# Patient Record
Sex: Female | Born: 1995 | Race: White | Hispanic: No | Marital: Single | Smoking: Never smoker
Health system: Southern US, Community
[De-identification: ages and names within clinical notes are randomized; demographics above are authoritative.]

## PROBLEM LIST (undated history)

## (undated) DIAGNOSIS — K219 Gastro-esophageal reflux disease without esophagitis: Secondary | ICD-10-CM

## (undated) DIAGNOSIS — F909 Attention-deficit hyperactivity disorder, unspecified type: Secondary | ICD-10-CM

## (undated) DIAGNOSIS — F419 Anxiety disorder, unspecified: Secondary | ICD-10-CM

## (undated) HISTORY — DX: Gastro-esophageal reflux disease without esophagitis: K21.9

## (undated) HISTORY — DX: Attention-deficit hyperactivity disorder, unspecified type: F90.9

## (undated) HISTORY — PX: TONSILLECTOMY: SUR1361

## (undated) HISTORY — DX: Anxiety disorder, unspecified: F41.9

## (undated) HISTORY — PX: WISDOM TOOTH EXTRACTION: SHX21

---
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
## 2014-11-13 ENCOUNTER — Other Ambulatory Visit: Payer: Self-pay | Admitting: Pediatrics

## 2014-11-13 ENCOUNTER — Ambulatory Visit
Admission: RE | Admit: 2014-11-13 | Discharge: 2014-11-13 | Disposition: A | Discharge status: Home / Self Care | Payer: No Typology Code available for payment source | Source: Ambulatory Visit | Attending: Pediatrics | Admitting: Pediatrics

## 2014-11-13 DIAGNOSIS — M419 Scoliosis, unspecified: Secondary | ICD-10-CM | POA: Insufficient documentation

## 2014-11-13 DIAGNOSIS — M549 Dorsalgia, unspecified: Secondary | ICD-10-CM | POA: Insufficient documentation

## 2014-11-22 DIAGNOSIS — M2170 Unequal limb length (acquired), unspecified site: Secondary | ICD-10-CM | POA: Insufficient documentation

## 2015-02-21 ENCOUNTER — Ambulatory Visit (INDEPENDENT_AMBULATORY_CARE_PROVIDER_SITE_OTHER): Payer: BLUE CROSS/BLUE SHIELD | Admitting: Internal Medicine

## 2015-02-21 DIAGNOSIS — T753XXA Motion sickness, initial encounter: Secondary | ICD-10-CM

## 2015-02-21 DIAGNOSIS — Z7189 Other specified counseling: Secondary | ICD-10-CM

## 2015-02-21 DIAGNOSIS — Z9189 Other specified personal risk factors, not elsewhere classified: Secondary | ICD-10-CM

## 2015-02-21 DIAGNOSIS — Z7184 Encounter for health counseling related to travel: Secondary | ICD-10-CM

## 2015-02-21 DIAGNOSIS — Z23 Encounter for immunization: Secondary | ICD-10-CM | POA: Diagnosis not present

## 2015-02-21 MED ORDER — TYPHOID VACCINE PO CPDR: 1.0000 | DELAYED_RELEASE_CAPSULE | ORAL | Status: DC

## 2015-02-21 MED ORDER — ATOVAQUONE-PROGUANIL HCL 250-100 MG PO TABS: 1.0000 | ORAL_TABLET | Freq: Every day | ORAL | Status: DC

## 2015-02-21 MED ORDER — PROMETHAZINE HCL 25 MG PO TABS: 25.0000 mg | ORAL_TABLET | Freq: Four times a day (QID) | ORAL | Status: DC | PRN

## 2015-02-21 MED ORDER — CIPROFLOXACIN HCL 500 MG PO TABS: 500.0000 mg | ORAL_TABLET | Freq: Two times a day (BID) | ORAL | Status: DC

## 2015-02-21 NOTE — Progress Notes (Signed)
  RFV: pretravel counseling for [place] church trip x 2 wk Subjective:    Patient ID: Kimberly Cameron, female    DOB: July 13, 1995, 20 y.o.   MRN: [id number]  HPI  Kathryne is a Loistine Simas F who will be traveling to [place] for 2 wk leaving march 5th- returning 18th. 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  She is currently working in [employer] school.  She is uptodate ->  Dtap, hep b #3, hib, influenza, MMR x 2, polio, td, varicella. Has not had flu vaccine this past year  Review of Systems     Objective:   Physical Exam        Assessment & Plan:  Pre travel counseling  Malaria and mosquito bite prevention = gave rx for malarone to start on march 3rd. Gave recs to use DEET mosquito spray and premethrin clothing spray  Traveler's diarrhea = gave precautions and rx for cipro to use if needed  Vaccines = will give yellow fever (due to itinerary going through [place]) and also hep A #1. Will give rx for oral typhoid vaccination. Recommended she gets flu vaccination 10 days prior to leaving

## 2016-04-20 ENCOUNTER — Ambulatory Visit: Payer: Self-pay | Admitting: Internal Medicine

## 2016-04-21 ENCOUNTER — Encounter: Payer: Self-pay | Admitting: Internal Medicine

## 2016-04-21 ENCOUNTER — Ambulatory Visit (INDEPENDENT_AMBULATORY_CARE_PROVIDER_SITE_OTHER): Payer: BLUE CROSS/BLUE SHIELD | Admitting: Internal Medicine

## 2016-04-21 VITALS — BP 102/64 | HR 94 | Ht 68.0 in | Wt 177.0 lb

## 2016-04-21 DIAGNOSIS — K219 Gastro-esophageal reflux disease without esophagitis: Secondary | ICD-10-CM | POA: Insufficient documentation

## 2016-04-21 DIAGNOSIS — J342 Deviated nasal septum: Secondary | ICD-10-CM | POA: Diagnosis not present

## 2016-04-21 DIAGNOSIS — J329 Chronic sinusitis, unspecified: Secondary | ICD-10-CM | POA: Diagnosis not present

## 2016-04-21 MED ORDER — RANITIDINE HCL 75 MG/5ML PO SYRP: 150.0000 mg | ORAL_SOLUTION | Freq: Every day | ORAL | 0 refills | Status: DC

## 2016-04-21 NOTE — Patient Instructions (Signed)
Gastroesophageal Reflux Disease, Adult Normally, food travels down the esophagus and stays in the stomach to be digested. However, when a person has gastroesophageal reflux disease (GERD), food and stomach acid move back up into the esophagus. When this happens, the esophagus becomes sore and inflamed. Over time, GERD can create small holes (ulcers) in the lining of the esophagus. What are the causes? This condition is caused by a problem with the muscle between the esophagus and the stomach (lower esophageal sphincter, or LES). Normally, the LES muscle closes after food passes through the esophagus to the stomach. When the LES is weakened or abnormal, it does not close properly, and that allows food and stomach acid to go back up into the esophagus. The LES can be weakened by certain dietary substances, medicines, and medical conditions, including:  Tobacco use.  Pregnancy.  Having a hiatal hernia.  Heavy alcohol use.  Certain foods and beverages, such as coffee, chocolate, onions, and peppermint.  What increases the risk? This condition is more likely to develop in:  People who have an increased body weight.  People who have connective tissue disorders.  People who use NSAID medicines.  What are the signs or symptoms? Symptoms of this condition include:  Heartburn.  Difficult or painful swallowing.  The feeling of having a lump in the throat.  Abitter taste in the mouth.  Bad breath.  Having a large amount of saliva.  Having an upset or bloated stomach.  Belching.  Chest pain.  Shortness of breath or wheezing.  Ongoing (chronic) cough or a night-time cough.  Wearing away of tooth enamel.  Weight loss.  Different conditions can cause chest pain. Make sure to see your health care provider if you experience chest pain. How is this diagnosed? Your health care provider will take a medical history and perform a physical exam. To determine if you have mild or severe  GERD, your health care provider may also monitor how you respond to treatment. You may also have other tests, including:  An endoscopy toexamine your stomach and esophagus with a small camera.  A test thatmeasures the acidity level in your esophagus.  A test thatmeasures how much pressure is on your esophagus.  A barium swallow or modified barium swallow to show the shape, size, and functioning of your esophagus.  How is this treated? The goal of treatment is to help relieve your symptoms and to prevent complications. Treatment for this condition may vary depending on how severe your symptoms are. Your health care provider may recommend:  Changes to your diet.  Medicine.  Surgery.  Follow these instructions at home: Diet  Follow a diet as recommended by your health care provider. This may involve avoiding foods and drinks such as: ? Coffee and tea (with or without caffeine). ? Drinks that containalcohol. ? Energy drinks and sports drinks. ? Carbonated drinks or sodas. ? Chocolate and cocoa. ? Peppermint and mint flavorings. ? Garlic and onions. ? Horseradish. ? Spicy and acidic foods, including peppers, chili powder, curry powder, vinegar, hot sauces, and barbecue sauce. ? Citrus fruit juices and citrus fruits, such as oranges, lemons, and limes. ? Tomato-based foods, such as red sauce, chili, salsa, and pizza with red sauce. ? Fried and fatty foods, such as donuts, french fries, potato chips, and high-fat dressings. ? High-fat meats, such as hot dogs and fatty cuts of red and white meats, such as rib eye steak, sausage, ham, and bacon. ? High-fat dairy items, such as whole milk,   butter, and cream cheese.  Eat small, frequent meals instead of large meals.  Avoid drinking large amounts of liquid with your meals.  Avoid eating meals during the 2-3 hours before bedtime.  Avoid lying down right after you eat.  Do not exercise right after you eat. General  instructions  Pay attention to any changes in your symptoms.  Take over-the-counter and prescription medicines only as told by your health care provider. Do not take aspirin, ibuprofen, or other NSAIDs unless your health care provider told you to do so.  Do not use any tobacco products, including cigarettes, chewing tobacco, and e-cigarettes. If you need help quitting, ask your health care provider.  Wear loose-fitting clothing. Do not wear anything tight around your waist that causes pressure on your abdomen.  Raise (elevate) the head of your bed 6 inches (15cm).  Try to reduce your stress, such as with yoga or meditation. If you need help reducing stress, ask your health care provider.  If you are overweight, reduce your weight to an amount that is healthy for you. Ask your health care provider for guidance about a safe weight loss goal.  Keep all follow-up visits as told by your health care provider. This is important. Contact a health care provider if:  You have new symptoms.  You have unexplained weight loss.  You have difficulty swallowing, or it hurts to swallow.  You have wheezing or a persistent cough.  Your symptoms do not improve with treatment.  You have a hoarse voice. Get help right away if:  You have pain in your arms, neck, jaw, teeth, or back.  You feel sweaty, dizzy, or light-headed.  You have chest pain or shortness of breath.  You vomit and your vomit looks like blood or coffee grounds.  You faint.  Your stool is bloody or black.  You cannot swallow, drink, or eat. This information is not intended to replace advice given to you by your health care provider. Make sure you discuss any questions you have with your health care provider. Document Released: 09/30/2004 Document Revised: 05/21/2015 Document Reviewed: 04/17/2014 Elsevier Interactive Patient Education  2017 Elsevier Inc.  

## 2016-04-21 NOTE — Progress Notes (Signed)
Date:  04/21/2016   Name:  Kimberly Cameron   DOB:  1995/12/21   MRN:  [id number]  New patient transitioning from Pediatrics. Chief Complaint: Establish Care (Been Seeing [facility] and now needs PCP as adult. ); Gastroesophageal Reflux (Comes in waves- and only affects pt when lay down at night. Feels it in throat. Terrified of vomitting so sleeps sitting up several weeks at a time to avoid acid. Takes tums- but not helping. Doesn't want to take daily medication but jsut wants PRN medication. ); and Sinusitis (Always has sinus problems- happens atleast 5 to 6 times a year. Normally given antibiotics- doesn't never completely go away. Always has nasal drainage, bumps in throat from a lot of drainage. Has drainage today but no other symptoms. 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 She reports no chest pain or no wheezing. This is a recurrent problem. The problem occurs frequently. The problem has been waxing and waning. The heartburn is located in the substernum. The heartburn is of moderate intensity. The symptoms are aggravated by lying down and certain foods. Pertinent negatives include no fatigue. There are no known risk factors. She has tried an antacid and a PPI (but got worried about taking prilosec daily) for the symptoms. Past procedures do not include an EGD, esophageal pH monitoring, H. pylori antibody titer or a UGI.  Sinusitis  This is a recurrent problem. There has been no fever. Associated symptoms include congestion. Pertinent negatives include no chills or shortness of breath. Past treatments include oral decongestants and spray decongestants. The treatment provided mild (temporary relief only) relief.  Has recurrent infections treated with antibiotics. Not on any daily mediations.    Review of Systems  Constitutional: Negative for chills, fatigue and fever.  HENT: Positive  for congestion, postnasal drip and sinus pain.   Eyes: Negative for visual disturbance.  Respiratory: Negative for chest tightness, shortness of breath and wheezing.   Cardiovascular: Negative for chest pain and palpitations.  Gastrointestinal: Positive for heartburn and nausea.       Reflux    Patient Active Problem List   Diagnosis Date Noted  . 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 TONSILLECTOMY     in 7th grade    Social History  Substance Use Topics  . Smoking status: Never Smoker  . Smokeless tobacco: Never Used  . Alcohol use Yes     Medication list has been reviewed and updated.   Physical Exam  Constitutional: She is oriented to person, place, and time. She appears well-developed. No distress.  HENT:  Head: Normocephalic and atraumatic.  Right Ear: Tympanic membrane and ear canal normal.  Left Ear: Tympanic membrane and ear canal normal.  Nose: Septal deviation present. Right sinus exhibits no maxillary sinus tenderness and no frontal sinus tenderness. Left sinus exhibits no maxillary sinus tenderness and no frontal sinus tenderness.  Mouth/Throat: No posterior oropharyngeal edema.  Cardiovascular: Normal rate, regular rhythm and normal heart sounds.   Pulmonary/Chest: Effort normal and breath sounds normal. No respiratory distress.  Abdominal: Soft. Normal appearance and bowel sounds are normal. There is no tenderness.  Musculoskeletal: Normal range of motion.  Neurological: She is alert and oriented to person, place, and time.  Skin: Skin is warm and dry.  No rash noted.  Psychiatric: She has a normal mood and affect. Her behavior is normal. Thought content normal.  Nursing note and vitals reviewed.   BP 102/64 (BP Location: Right Arm, Patient Position: Sitting, Cuff Size: Normal)   Pulse 94   Ht  (1.727 m)   Wt 177 lb (80.3 kg)    LMP 04/07/2016 (Within Weeks)   SpO2 99%   BMI 26.91 kg/m   Assessment and Plan: 1. Gastroesophageal reflux disease, esophagitis presence not specified Begin zantac qPM Elevate HOB Continue PRN tums - ranitidine (ZANTAC) 75 MG/5ML syrup; Take 10 mLs (150 mg total) by mouth daily.  Dispense: 180 mL; Refill: 0  2. Chronic sinusitis, unspecified location claritin daily Avoid Flonase due to nose bleeds - Ambulatory referral to ENT  3. Deviated nasal septum - Ambulatory referral to ENT   Meds ordered this encounter  Medications  . ranitidine (ZANTAC) 75 MG/5ML syrup    Sig: Take 10 mLs (150 mg total) by mouth daily.    Dispense:  180 mL    Refill:  0    No grape flavor please    Bari Edward, MD [facility]  04/21/2016

## 2016-07-10 IMAGING — CR DG SACRUM/COCCYX 2+V
1 series · 3 of 3 positions shown · non-contrast
Comparison: None.

CLINICAL DATA: Chronic low back pain.  No trauma history submitted.

EXAM:
SACRUM AND COCCYX - 2+ VIEW

[Series 1: dg sacrum/coccyx · 0.14mm/px · 3 of 3 slices shown]
[im 1/3]
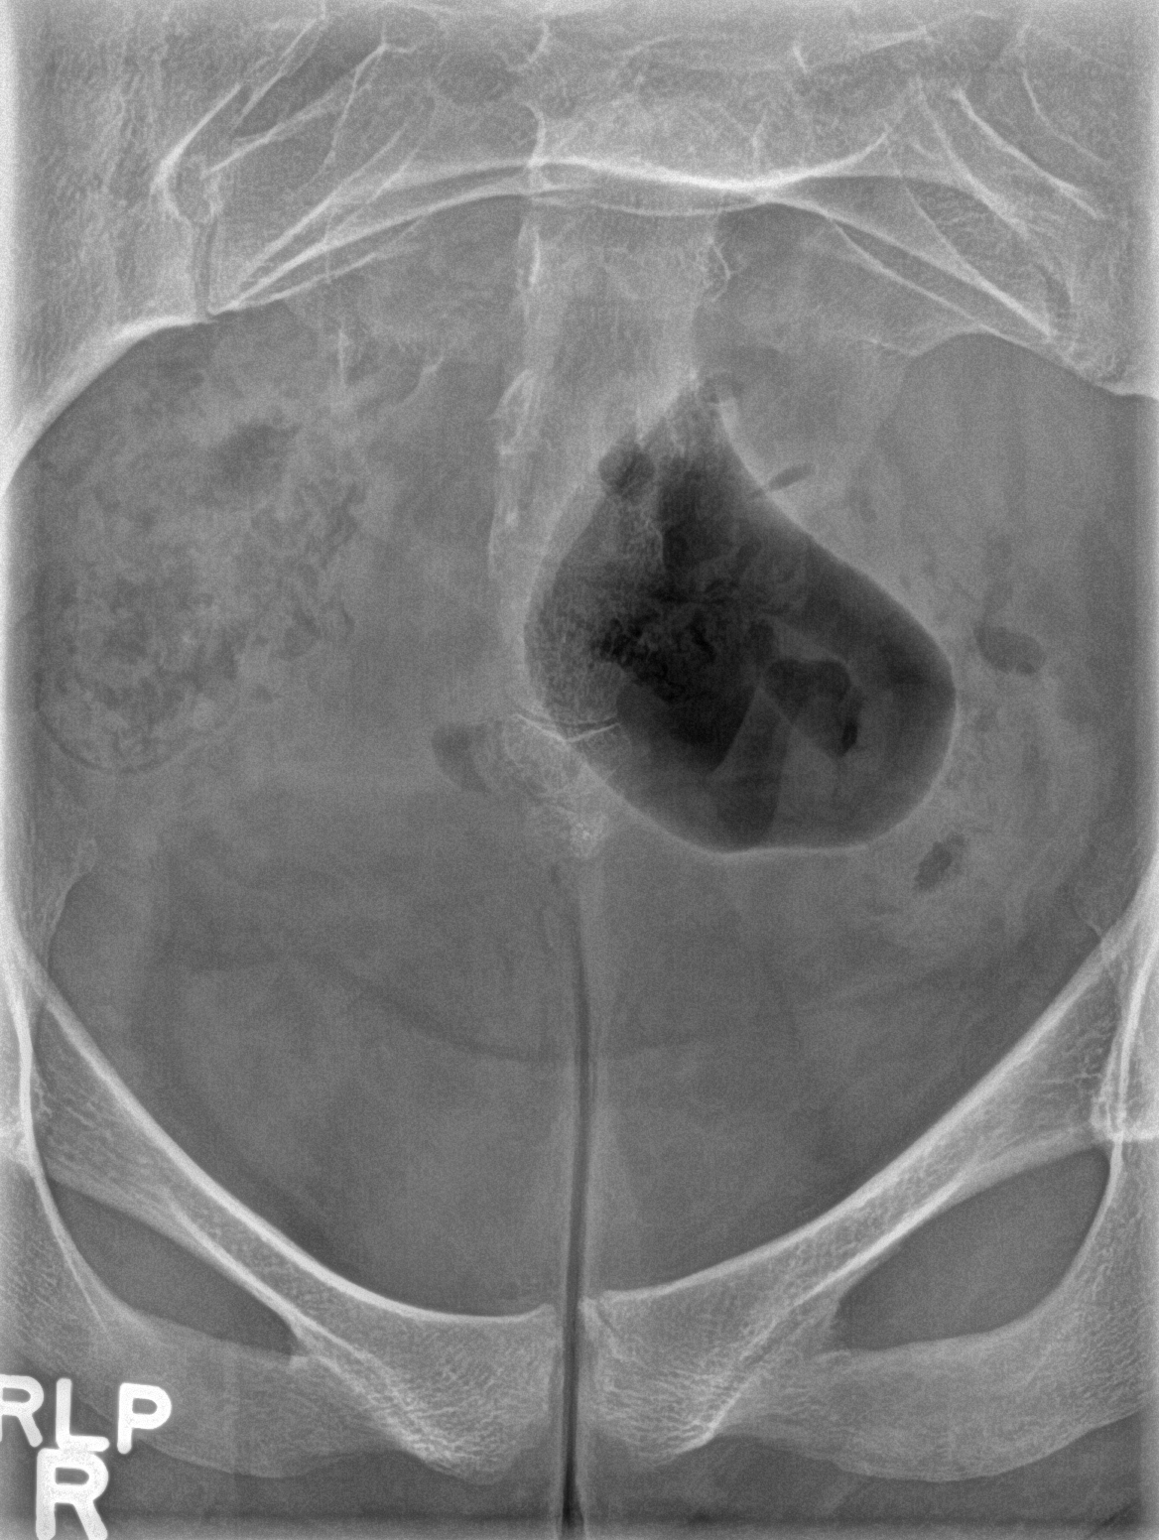
[im 2/3]
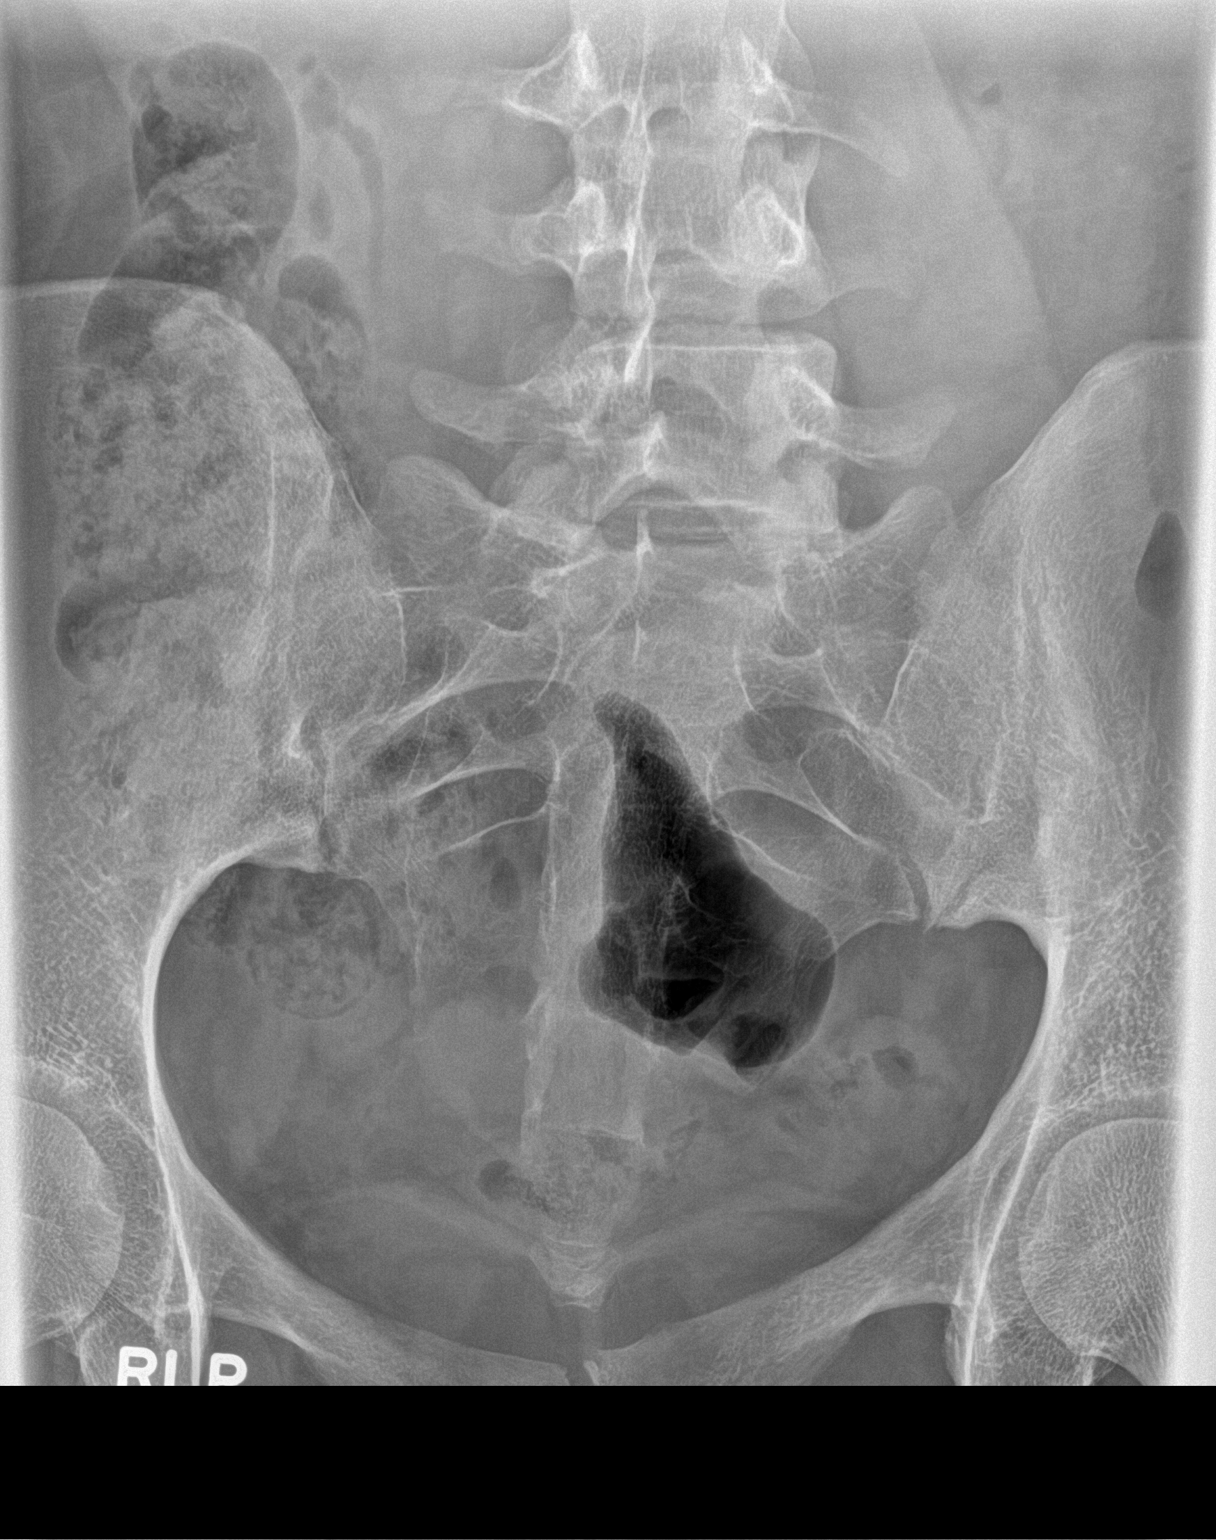
[im 3/3]
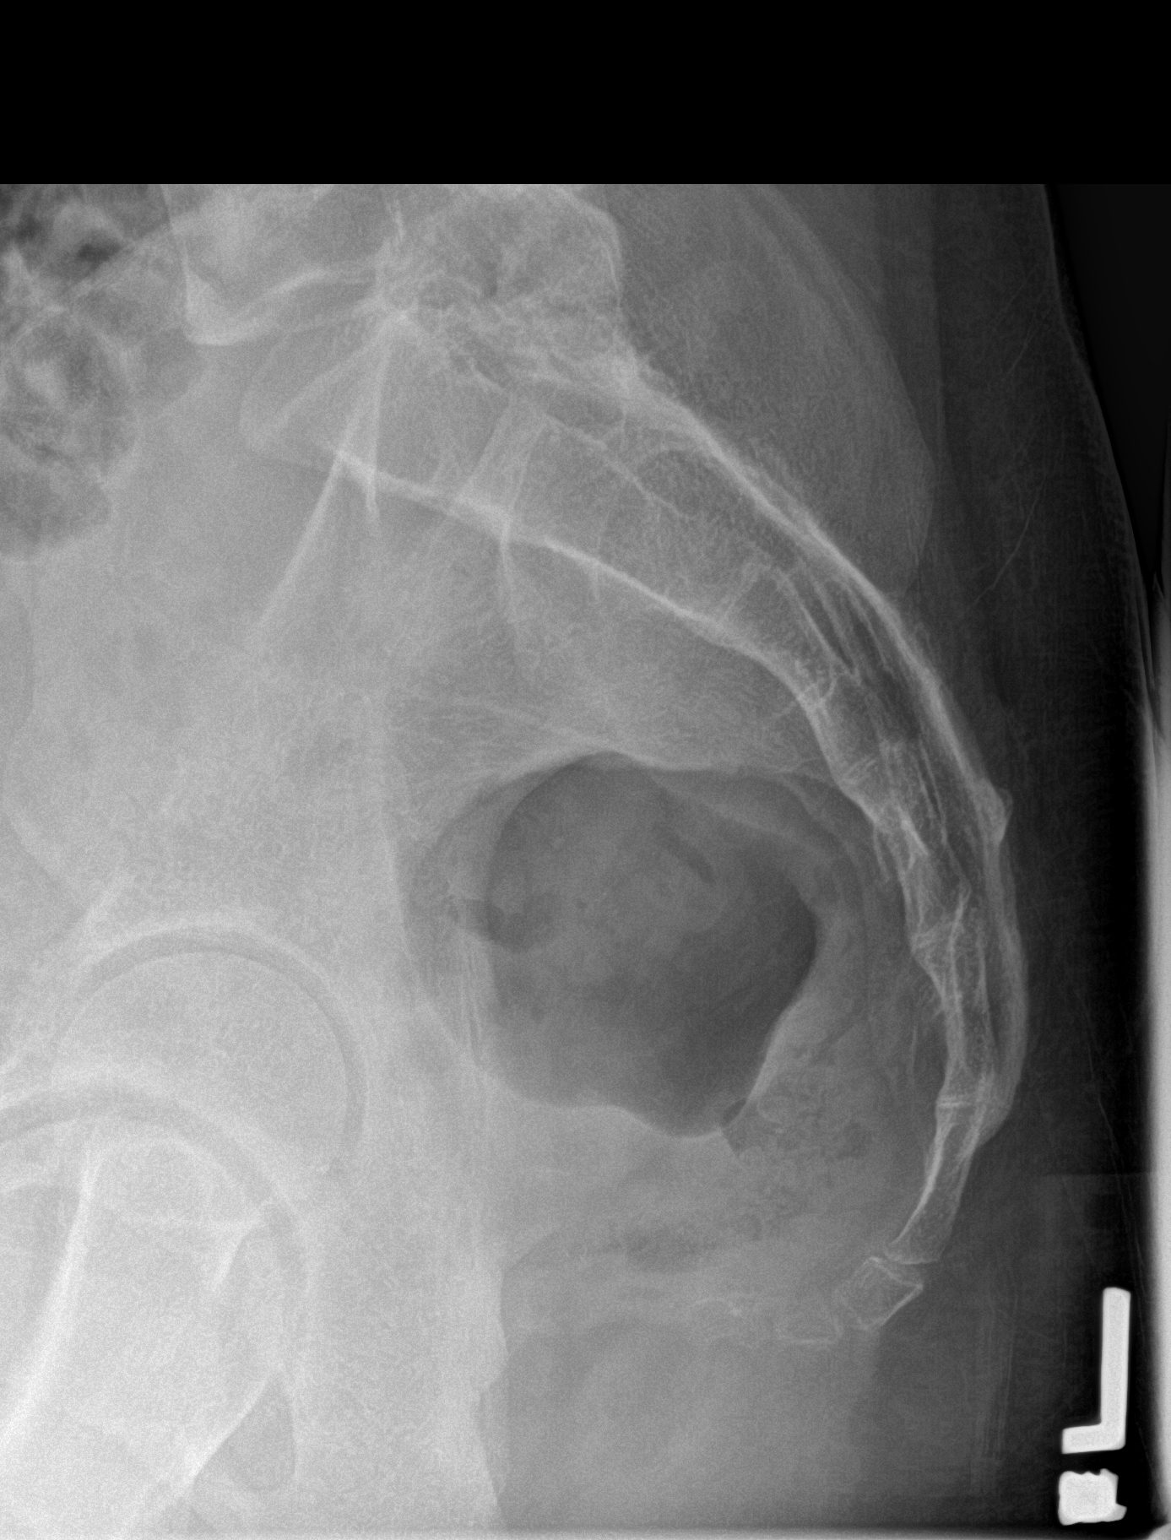

[3 of 3 positions shown; findings below may reference images not displayed]

FINDINGS: No sacral abnormality. Sacroiliac joints are symmetric. Remainder of
the imaged sacrum unremarkable..
IMPRESSION: No acute osseous abnormality.

## 2016-11-29 ENCOUNTER — Other Ambulatory Visit: Payer: Self-pay | Admitting: Internal Medicine

## 2016-11-29 DIAGNOSIS — K219 Gastro-esophageal reflux disease without esophagitis: Secondary | ICD-10-CM

## 2017-04-08 ENCOUNTER — Ambulatory Visit: Payer: BLUE CROSS/BLUE SHIELD | Admitting: Internal Medicine

## 2017-04-08 ENCOUNTER — Encounter: Payer: Self-pay | Admitting: Internal Medicine

## 2017-04-08 VITALS — BP 112/78 | HR 96 | Ht 68.0 in | Wt 166.0 lb

## 2017-04-08 DIAGNOSIS — R634 Abnormal weight loss: Secondary | ICD-10-CM

## 2017-04-08 DIAGNOSIS — R42 Dizziness and giddiness: Secondary | ICD-10-CM | POA: Diagnosis not present

## 2017-04-08 DIAGNOSIS — K219 Gastro-esophageal reflux disease without esophagitis: Secondary | ICD-10-CM

## 2017-04-08 MED ORDER — RANITIDINE HCL 75 MG/5ML PO SYRP: ORAL_SOLUTION | ORAL | 3 refills | Status: DC

## 2017-04-08 NOTE — Progress Notes (Signed)
Date:  04/08/2017   Name:  Kimberly Cameron   DOB:  1995-07-02   MRN:  [id number]   Chief Complaint: Extremity Weakness (Shakes started yesterday. Got in car and was very dizzy. Hands were shaking. Anytime going to gym feels weak, and nausea. Has to lay on the couch the rest of the day and even if wanting to get up she is too weak too. No appetite. Not eating much. )  Lightheadedness - she feels weak after working out at the gym - not especially vigorous or prolonged.  She admits to skipping meals due to school and work schedule.  When she does eat, she feels nauseated at times and bloated.  She has done a home pregnancy test which was negative.  GERD - longstanding sx, not worse.  Last year had ranitidine liquid which helped but she ran out of refills.    No vomiting - but nausea and bloating are common.  Has never had GI evaluation.  Weight Loss - she was trying to lose weight with decreased calories and exercise.  Weight has been fairly stable - no recent acute loss but 11 lbs since last year. She denies bulimia or abnormal body image.  Review of Systems  Constitutional: Positive for fatigue and unexpected weight change. Negative for chills and fever.  Respiratory: Negative for cough, chest tightness and shortness of breath.   Cardiovascular: Negative for chest pain, palpitations and leg swelling.  Gastrointestinal: Positive for abdominal distention and nausea. Negative for abdominal pain, blood in stool, constipation and vomiting.  Genitourinary: Negative for dysuria and menstrual problem.  Musculoskeletal: Negative for arthralgias and back pain.  Skin: Negative for rash.  Neurological: Positive for dizziness, weakness and light-headedness. Negative for tremors, syncope and headaches.  Psychiatric/Behavioral: Negative for dysphoric mood and sleep disturbance. The patient is not nervous/anxious.     Patient Active Problem List   Diagnosis Date Noted  . Deviated nasal septum  04/21/2016  . Gastroesophageal reflux disease 04/21/2016  . Chronic sinusitis 04/21/2016  . 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 TONSILLECTOMY     in 7th grade    Social History   Tobacco Use  . Smoking status: Never Smoker  . Smokeless tobacco: Never Used  Substance Use Topics  . Alcohol use: Yes  . Drug use: No     Medication list has been reviewed and updated.  PHQ 2/9 Scores 04/08/2017  PHQ - 2 Score 0    Physical Exam  Constitutional: She is oriented to person, place, and time. She appears well-developed. No distress.  HENT:  Head: Normocephalic and atraumatic.  Neck: Normal range of motion. Neck supple. No thyromegaly present.  Cardiovascular: Normal rate, regular rhythm and normal heart sounds.  Pulmonary/Chest: Effort normal and breath sounds normal. No respiratory distress. She has no wheezes.  Abdominal: Soft. Normal appearance and bowel sounds are normal. There is no tenderness. There is no rigidity, no rebound and no guarding.  Musculoskeletal: Normal range of motion. She exhibits no edema or tenderness.  Neurological: She is alert and oriented to person, place, and time.  Skin: Skin is warm and dry. No rash noted.  Psychiatric: Her speech is normal and behavior is normal. Thought content normal. Her mood appears anxious. She does not exhibit a depressed mood.  Nursing note and vitals reviewed.   BP 112/78  Pulse 96   Ht 5\' 8"  (1.727 m)   Wt 166 lb (75.3 kg)   LMP 03/20/2017 (Within Days)   SpO2 98%   BMI 25.24 kg/m   Assessment and Plan: 1. Gastroesophageal reflux disease, esophagitis presence not specified Resume H2 blocker - may be contributing to nausea/weakness/decreased intake - CBC with Differential/Platelet - ranitidine (ZANTAC) 75 MG/5ML syrup; TAKE 10ML BY MOUTH EVERY DAY  Dispense: 180 mL; Refill: 3  2. Weight  loss Check thyroid Encourage at least 2 balanced meals per day - TSH  3. Episodic lightheadedness continue adequate fluids Rule out metabolic and anemia - Comprehensive metabolic panel   Meds ordered this encounter  Medications  . ranitidine (ZANTAC) 75 MG/5ML syrup    Sig: TAKE 10ML BY MOUTH EVERY DAY    Dispense:  180 mL    Refill:  3    Partially dictated using Animal nutritionistDragon software. Any errors are unintentional.  Bari EdwardLaura Elzy Tomasello, MD [facility]  04/08/2017

## 2017-04-09 LAB — CBC WITH DIFFERENTIAL/PLATELET
Basophils Absolute: 0 10*3/uL (ref 0.0–0.2)
Basos: 0 %
EOS (ABSOLUTE): 0.1 10*3/uL (ref 0.0–0.4)
Eos: 1 %
HEMOGLOBIN: 14 g/dL (ref 11.1–15.9)
Hematocrit: 42 % (ref 34.0–46.6)
IMMATURE GRANS (ABS): 0 10*3/uL (ref 0.0–0.1)
IMMATURE GRANULOCYTES: 0 %
LYMPHS: 31 %
Lymphocytes Absolute: 1.6 10*3/uL (ref 0.7–3.1)
MCH: 30.7 pg (ref 26.6–33.0)
MCHC: 33.3 g/dL (ref 31.5–35.7)
MCV: 92 fL (ref 79–97)
MONOCYTES: 9 %
Monocytes Absolute: 0.5 10*3/uL (ref 0.1–0.9)
NEUTROS PCT: 59 %
Neutrophils Absolute: 2.9 10*3/uL (ref 1.4–7.0)
Platelets: 287 10*3/uL (ref 150–379)
RBC: 4.56 x10E6/uL (ref 3.77–5.28)
RDW: 13.2 % (ref 12.3–15.4)
WBC: 5.1 10*3/uL (ref 3.4–10.8)

## 2017-04-09 LAB — COMPREHENSIVE METABOLIC PANEL
ALBUMIN: 4.3 g/dL (ref 3.5–5.5)
ALT: 11 IU/L (ref 0–32)
AST: 15 IU/L (ref 0–40)
Albumin/Globulin Ratio: 1.9 (ref 1.2–2.2)
Alkaline Phosphatase: 70 IU/L (ref 39–117)
BILIRUBIN TOTAL: 0.7 mg/dL (ref 0.0–1.2)
BUN/Creatinine Ratio: 10 (ref 9–23)
BUN: 7 mg/dL (ref 6–20)
CALCIUM: 9.2 mg/dL (ref 8.7–10.2)
CHLORIDE: 103 mmol/L (ref 96–106)
CO2: 22 mmol/L (ref 20–29)
CREATININE: 0.69 mg/dL (ref 0.57–1.00)
GFR calc non Af Amer: 124 mL/min/{1.73_m2} (ref >59)
GFR, EST AFRICAN AMERICAN: 143 mL/min/{1.73_m2} (ref >59)
GLUCOSE: 82 mg/dL (ref 65–99)
Globulin, Total: 2.3 g/dL (ref 1.5–4.5)
Potassium: 4.6 mmol/L (ref 3.5–5.2)
Sodium: 140 mmol/L (ref 134–144)
Total Protein: 6.6 g/dL (ref 6.0–8.5)

## 2017-04-09 LAB — TSH: TSH: 1.75 u[IU]/mL (ref 0.450–4.500)

## 2018-01-22 ENCOUNTER — Encounter: Payer: Self-pay | Admitting: Internal Medicine

## 2018-01-23 ENCOUNTER — Encounter: Payer: Self-pay | Admitting: Internal Medicine

## 2018-01-23 ENCOUNTER — Ambulatory Visit (INDEPENDENT_AMBULATORY_CARE_PROVIDER_SITE_OTHER): Payer: BLUE CROSS/BLUE SHIELD | Admitting: Internal Medicine

## 2018-01-23 ENCOUNTER — Encounter: Payer: Self-pay | Admitting: *Deleted

## 2018-01-23 VITALS — BP 116/70 | HR 124 | Ht 68.0 in | Wt 175.0 lb

## 2018-01-23 DIAGNOSIS — K219 Gastro-esophageal reflux disease without esophagitis: Secondary | ICD-10-CM

## 2018-01-23 DIAGNOSIS — M6281 Muscle weakness (generalized): Secondary | ICD-10-CM | POA: Insufficient documentation

## 2018-01-23 DIAGNOSIS — R5383 Other fatigue: Secondary | ICD-10-CM | POA: Diagnosis not present

## 2018-01-23 NOTE — Progress Notes (Signed)
Date:  01/23/2018   Name:  Kimberly Cameron   DOB:  1995-11-10   MRN:  [id number]   Chief Complaint: Fatigue (Same issue as last visit. Recently have had two episodes of dizziness. Feeling dizzy and blacking out for a few seconds. Felt hot, the first episode happened while she was driving. )  Gastroesophageal Reflux  She complains of heartburn, a sore throat and water brash. She reports no chest pain, no coughing, no dysphagia or no wheezing. This is a recurrent problem. The current episode started more than 1 year ago. The problem occurs frequently. The problem has been unchanged. The heartburn duration is several minutes. The heartburn is located in the substernum. The heartburn is of moderate intensity. The heartburn does not wake her from sleep. The heartburn does not limit her activity. Associated symptoms include fatigue and muscle weakness. Pertinent negatives include no anemia, melena or weight loss. There are no known risk factors. She has tried a histamine-2 antagonist for the symptoms. The treatment provided moderate relief.  Extremity Weakness   This is a recurrent problem. The current episode started more than 1 year ago. There has been no history of extremity trauma. The problem occurs intermittently. The problem has been unchanged. The patient is experiencing no pain. Pertinent negatives include no fever or numbness. The symptoms are aggravated by activity. She has tried rest for the symptoms.  Patient described extreme fatigue and weakness after mild exercise.  She has tried eating and drinking fluids before exercise.  She has tried having a smoothie afterwards, she has limited her cardio workouts to 10-15 minutes and limited strength training.  Despite this, she becomes so fatigued with arm and leg heaviness that she has to lie down for 1-2 hours before gaining enough strength to get up.  In addition to episodes of general weakness, she describes two episodes that occurred while  driving, once was 30 minutes after a blueberry muffin - she felt very hot, nauseated and lightheaded, she had to turn [facility] the [facility] full blast and felt better after just 20 seconds.  She did not pass out.  The other time was 2 hours after eating and was similar. On a separate occasion, she was able to check her BS just a few minutes after an episode and it was 80.  Overall she has tried to improve her diet and move away from a vegetarian one.  She drinks plenty of fluids daily.  She eats fruits and vegetables, very little fast food.  She is not aware of any foods making her GERD worse or her feeling of fatigue and weakness.  Review of Systems  Constitutional: Positive for fatigue. Negative for appetite change, chills, fever, unexpected weight change and weight loss.  HENT: Positive for sore throat.   Eyes: Negative for photophobia and visual disturbance.  Respiratory: Negative for cough, chest tightness, shortness of breath and wheezing.   Cardiovascular: Negative for chest pain, palpitations and leg swelling.  Gastrointestinal: Positive for heartburn. Negative for constipation, diarrhea, dysphagia, melena and vomiting.       Reflux  Genitourinary: Negative for difficulty urinating.  Musculoskeletal: Positive for extremity weakness and muscle weakness. Negative for arthralgias and myalgias.  Neurological: Positive for weakness and light-headedness. Negative for dizziness, tremors, seizures, syncope, speech difficulty, numbness and headaches.  Hematological: Negative for adenopathy.  Psychiatric/Behavioral: Negative for sleep disturbance. The patient is not nervous/anxious.     Patient Active Problem List   Diagnosis Date Noted  . Deviated nasal  septum 04/21/2016  . Gastroesophageal reflux disease 04/21/2016  . Chronic sinusitis 04/21/2016  . 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 TONSILLECTOMY     in 7th grade     Social History   Tobacco Use  . Smoking status: Never Smoker  . Smokeless tobacco: Never Used  Substance Use Topics  . Alcohol use: Yes  . Drug use: No     Medication list has been reviewed and updated.  No outpatient medications have been marked as taking for the 01/23/18 encounter (Office Visit) with Reubin MilanBerglund, Summer Parthasarathy H, MD.    [facility] Sc[facility] 04/08/2017  PHQ - 2 Score 0    Physical Exam Vitals signs and nursing note reviewed.  Constitutional:      General: She is not in acute distress.    Appearance: She is well-developed.  HENT:     Head: Normocephalic and atraumatic.  Eyes:     Extraocular Movements: Extraocular movements intact.     Conjunctiva/sclera: Conjunctivae normal.     Pupils: Pupils are equal, round, and reactive to light.  Neck:     Musculoskeletal: Normal range of motion and neck supple.     Thyroid: No thyroid mass.     Vascular: No carotid bruit.  Cardiovascular:     Rate and Rhythm: Normal rate and regular rhythm.     Pulses: Normal pulses.     Heart sounds: No murmur.  Pulmonary:     Effort: Pulmonary effort is normal. No respiratory distress.     Breath sounds: Normal breath sounds.  Abdominal:     General: Abdomen is flat. Bowel sounds are normal.     Palpations: Abdomen is soft.     Tenderness: There is no abdominal tenderness.  Musculoskeletal: Normal range of motion.  Lymphadenopathy:     Cervical: No cervical adenopathy.  Skin:    General: Skin is warm and dry.     Findings: No rash.  Neurological:     General: No focal deficit present.     Mental Status: She is alert and oriented to person, place, and time.     Cranial Nerves: Cranial nerves are intact.     Sensory: Sensation is intact.     Motor: Motor function is intact.     Coordination: Coordination is intact.     Gait: Gait is intact.     Deep Tendon Reflexes:     Reflex Scores:      Bicep reflexes are 2+ on the right side and 2+ on the left side.      Patellar reflexes  are 2+ on the right side and 2+ on the left side. Psychiatric:        Attention and Perception: Attention normal.        Mood and Affect: Mood normal.        Behavior: Behavior normal.        Thought Content: Thought content normal.     BP 116/70   Pulse (!) 124   Ht 5\' 8"  (1.727 m)   Wt 175 lb (79.4 kg)   LMP 01/02/2018 (Exact Date)   SpO2 99%   BMI 26.61 kg/m   Assessment and Plan: 1. Other fatigue Check B12 level and advise if low - Vitamin B12  2. Muscle weakness (generalized) Concern for possible neuro-muscular disorder  - Ambulatory referral to Neurology  3. Gastroesophageal reflux disease, esophagitis presence not specified Needs further evaluation and  management - Ambulatory referral to Gastroenterology   Partially dictated using Dragon software. Any errors are unintentional.  Bari Edward, MD [facility]  01/23/2018

## 2018-01-24 LAB — VITAMIN B12: Vitamin B-12: 420 pg/mL (ref 232–1245)

## 2018-02-14 ENCOUNTER — Ambulatory Visit: Payer: BLUE CROSS/BLUE SHIELD | Admitting: Gastroenterology

## 2019-10-10 ENCOUNTER — Telehealth: Payer: Self-pay

## 2019-10-10 NOTE — Telephone Encounter (Signed)
Yes this is ok 

## 2019-10-10 NOTE — Telephone Encounter (Signed)
Copied from CRM [id number]. Topic: General - Other >> Oct 10, 2019 11:11 AM Jaquita Rector A wrote: Reason for CRM: Patient called in stated that her therapist spoke to Joycelyn Man who agreed that she would take her on as a new patient was not able to confirm. Asking if Jennifer's nurse can confirm and call patient back and help her schedule a visit please call Ph# [phone]

## 2019-10-11 NOTE — Telephone Encounter (Signed)
Left message on VM that her appt is  11/16/19 at 2:00 p.m.

## 2019-11-16 ENCOUNTER — Ambulatory Visit: Payer: BLUE CROSS/BLUE SHIELD | Admitting: Physician Assistant

## 2019-11-19 NOTE — Progress Notes (Signed)
New patient visit   Patient: Kimberly Cameron   DOB: [birth year]   24 y.o. Female  MRN: [id number] Visit Date: 11/21/2019  Today's healthcare provider: Mar Daring, PA-C   Chief Complaint  Patient presents with   New Patient (Initial Visit)   Subjective    Kimberly Cameron is a 24 y.o. female who presents today as a new patient to establish care. Previously followed by Dr. Army Melia. Last seen by her in 2018. HPI  GERD: Paitent complains of heartburn. This has been associated with abdominal bloating, early satiety, fullness after meals, heartburn, midespigastric pain, nocturnal burning, symptoms primarily relate to meals, and lying down after meals and upper abdominal discomfort.  She denies chest pain, choking on food, cough, deep pressure at base of neck, difficulty swallowing, hematemesis, hoarseness, nausea, need to clear throat frequently, shortness of breath and unexpected weight loss. Symptoms have been present for several years. She denies dysphagia.  She has not lost weight. She denies melena, hematochezia, hematemesis, and coffee ground emesis. Medical therapy in the past has included Omeprazole. Zantac. She currently takes TUMS like 2 a day.  She also has anxiety. Overall, she is able to cope well and manages it well. She does have triggers, mostly social situations. She has 2 jobs and is also in school so there is a lot of stressors in her life. She reports her main triggers are large crowds, and one of her jobs she has to present. She reports on days where she presents in the afternoon she can normally do coping exercises and does ok. However, when she presents early in the morning she does not have that time and has increased anxiety. She is in counseling and that helps a lot.   Declined Influenza vaccine today.  No past medical history on file. Past Surgical History:  Procedure Laterality Date   TONSILLECTOMY     in 7th grade   Family Status  Relation Name  Status   Father  (Not Specified)   Brother  Alive   Family History  Problem Relation Age of Onset   Diabetes Father    Clotting disorder Brother    Social History   Socioeconomic History   Marital status: Single    Spouse name: Not on file   Number of children: Not on file   Years of education: Not on file   Highest education level: Not on file  Occupational History   Not on file  Tobacco Use   Smoking status: Never Smoker   Smokeless tobacco: Never Used  Substance and Sexual Activity   Alcohol use: Yes   Drug use: No   Sexual activity: Yes    Partners: Male    Birth control/protection: Condom  Other Topics Concern   Not on file  Social History Narrative   Not on file   Social Determinants of Health   Financial Resource Strain:    Difficulty of Paying Living Expenses: Not on file  Food Insecurity:    Worried About Charity fundraiser in the Last Year: Not on file   [employer] of Food in the Last Year: Not on file  Transportation Needs:    Lack of Transportation (Medical): Not on file   Lack of Transportation (Non-Medical): Not on file  Physical Activity:    Days of Exercise per Week: Not on file   Minutes of Exercise per Session: Not on file  Stress:    Feeling of Stress : Not on file  Social Connections:    Frequency of Communication with Friends and Family: Not on file   Frequency of Social Gatherings with Friends and Family: Not on file   Attends Religious Services: Not on file   Active Member of Clubs or Organizations: Not on file   Attends Archivist Meetings: Not on file   Marital Status: Not on file   No outpatient medications prior to visit.   No facility-administered medications prior to visit.   No Known Allergies  Immunization History  Administered Date(s) Administered   DTaP 09/29/1994, 11/26/1994, 02/04/1995, 11/15/1995, 01/06/1999   Hepatitis B 08/17/94, 09/29/1994, 04/28/1995   HiB (PRP-OMP)  09/29/1994, 11/26/1994, 02/04/1995, 11/15/1995   IPV 09/29/1994, 11/26/1994, 02/04/1995, 01/06/1999   Influenza Nasal 11/27/2012   MMR 08/10/1995, 01/06/1999   Meningococcal Conjugate 02/23/2008   Tdap 11/27/2012   Typhoid Live 02/21/2015   Varicella 08/10/1995, 03/22/1999   Yellow Fever 02/21/2015    Health Maintenance  Topic Date Due   Hepatitis C Screening  Never done   HIV Screening  Never done   PAP-Cervical Cytology Screening  Never done   PAP SMEAR-Modifier  Never done   INFLUENZA VACCINE  08/05/2019   TETANUS/TDAP  11/28/2022    Patient Care Team: Rubye Beach as PCP - General (Family Medicine)  Review of Systems  Constitutional: Negative.   HENT: Negative.   Eyes: Negative.   Respiratory: Negative.   Cardiovascular: Negative.   Gastrointestinal: Positive for abdominal distention, abdominal pain and nausea.  Endocrine: Negative.   Genitourinary: Negative.   Musculoskeletal: Positive for back pain.  Allergic/Immunologic: Negative.   Neurological: Positive for weakness.  Hematological: Negative.   Psychiatric/Behavioral: The patient is nervous/anxious.     Last CBC Lab Results  Component Value Date   WBC 5.1 04/08/2017   HGB 14.0 04/08/2017   HCT 42.0 04/08/2017   MCV 92 04/08/2017   MCH 30.7 04/08/2017   RDW 13.2 04/08/2017   PLT 287 37/48/2707   Last metabolic panel Lab Results  Component Value Date   GLUCOSE 82 04/08/2017   NA 140 04/08/2017   K 4.6 04/08/2017   CL 103 04/08/2017   CO2 22 04/08/2017   BUN 7 04/08/2017   CREATININE 0.69 04/08/2017   GFRNONAA 124 04/08/2017   GFRAA 143 04/08/2017   CALCIUM 9.2 04/08/2017   PROT 6.6 04/08/2017   ALBUMIN 4.3 04/08/2017   LABGLOB 2.3 04/08/2017   AGRATIO 1.9 04/08/2017   BILITOT 0.7 04/08/2017   ALKPHOS 70 04/08/2017   AST 15 04/08/2017   ALT 11 04/08/2017      Objective    BP 104/70 (BP Location: Left Arm, Patient Position: Sitting, Cuff Size: Large)    Pulse  98    Temp 98 F (36.7 C) (Oral)    Resp 16    Ht '5\' 8"'  (1.727 m)    Wt 187 lb 3.2 oz (84.9 kg)    LMP 11/14/2019    BMI 28.46 kg/m  Physical Exam Vitals reviewed.  Constitutional:      General: She is not in acute distress.    Appearance: Normal appearance. She is well-developed, well-groomed and overweight. She is not diaphoretic.  HENT:     Head: Normocephalic and atraumatic.     Right Ear: Tympanic membrane, ear canal and external ear normal.     Left Ear: Tympanic membrane, ear canal and external ear normal.  Eyes:     General: No scleral icterus.       Right eye: No  discharge.        Left eye: No discharge.     Extraocular Movements: Extraocular movements intact.     Conjunctiva/sclera: Conjunctivae normal.     Pupils: Pupils are equal, round, and reactive to light.  Neck:     Thyroid: No thyromegaly.     Vascular: No JVD.     Trachea: No tracheal deviation.  Cardiovascular:     Rate and Rhythm: Normal rate and regular rhythm.     Pulses: Normal pulses.     Heart sounds: Normal heart sounds. No murmur heard.  No friction rub. No gallop.   Pulmonary:     Effort: Pulmonary effort is normal. No respiratory distress.     Breath sounds: Normal breath sounds. No wheezing or rales.  Chest:     Chest wall: No tenderness.  Abdominal:     General: Abdomen is flat. Bowel sounds are normal. There is no distension.     Palpations: Abdomen is soft. There is no mass.     Tenderness: There is no abdominal tenderness. There is no guarding or rebound.  Musculoskeletal:        General: No tenderness. Normal range of motion.     Cervical back: Normal range of motion and neck supple. No tenderness.     Right lower leg: No edema.     Left lower leg: No edema.  Lymphadenopathy:     Cervical: No cervical adenopathy.  Skin:    General: Skin is warm and dry.     Capillary Refill: Capillary refill takes less than 2 seconds.     Findings: No rash.  Neurological:     General: No focal  deficit present.     Mental Status: She is alert and oriented to person, place, and time. Mental status is at baseline.  Psychiatric:        Mood and Affect: Mood normal.        Behavior: Behavior normal. Behavior is cooperative.        Thought Content: Thought content normal.        Judgment: Judgment normal.     Depression Screen PHQ 2/9 Scores 11/21/2019 04/08/2017  PHQ - 2 Score 1 0  PHQ- 9 Score 6 -   No results found for any visits on 11/21/19.  Assessment & Plan      1. Gastroesophageal reflux disease without esophagitis Suspect patient has gastric ulcer based on symptoms. Will try Sucralfate as below to see if this helps. Will f/u in 4 weeks.  - sucralfate (CARAFATE) 1 GM/10ML suspension; Take 10 mLs (1 g total) by mouth 4 (four) times daily -  with meals and at bedtime.  Dispense: 1200 mL; Refill: 0  2. PUD (peptic ulcer disease) See above medical treatment plan. - sucralfate (CARAFATE) 1 GM/10ML suspension; Take 10 mLs (1 g total) by mouth 4 (four) times daily -  with meals and at bedtime.  Dispense: 1200 mL; Refill: 0  3. GAD (generalized anxiety disorder) Manages for the most part on her own with counseling and coping mechanisms. Will give hydroxyzine as below for situational anxiety. May start with 1/2 tab and increase to 1 tab. Will f/u in 4 weeks.  - hydrOXYzine (ATARAX/VISTARIL) 10 MG tablet; Take 1 tablet (10 mg total) by mouth 3 (three) times daily as needed for anxiety.  Dispense: 90 tablet; Refill: 0  4. BMI 28.0-28.9,adult Patient reports when she exercises she feels wiped out for over a day after. She also reports that even  short exercises around 10 minutes can do this as well.   I spent approximately 50 minutes with the patient today. Over 50% of this time was spent with counseling and educating the patient.  No follow-ups on file.     Reynolds Bowl, PA-C, have reviewed all documentation for this visit. The documentation on 11/22/19 for the exam,  diagnosis, procedures, and orders are all accurate and complete.   Rubye Beach  [facility] [phone] (phone) [phone] (fax)  [place]

## 2019-11-21 ENCOUNTER — Telehealth: Payer: Self-pay | Admitting: Physician Assistant

## 2019-11-21 ENCOUNTER — Ambulatory Visit (INDEPENDENT_AMBULATORY_CARE_PROVIDER_SITE_OTHER): Payer: 59 | Admitting: Physician Assistant

## 2019-11-21 ENCOUNTER — Other Ambulatory Visit: Payer: Self-pay

## 2019-11-21 ENCOUNTER — Encounter: Payer: Self-pay | Admitting: Physician Assistant

## 2019-11-21 VITALS — BP 104/70 | HR 98 | Temp 98.0°F | Resp 16 | Ht 68.0 in | Wt 187.2 lb

## 2019-11-21 DIAGNOSIS — K219 Gastro-esophageal reflux disease without esophagitis: Secondary | ICD-10-CM

## 2019-11-21 DIAGNOSIS — F411 Generalized anxiety disorder: Secondary | ICD-10-CM | POA: Diagnosis not present

## 2019-11-21 DIAGNOSIS — K279 Peptic ulcer, site unspecified, unspecified as acute or chronic, without hemorrhage or perforation: Secondary | ICD-10-CM | POA: Diagnosis not present

## 2019-11-21 DIAGNOSIS — Z6828 Body mass index (BMI) 28.0-28.9, adult: Secondary | ICD-10-CM

## 2019-11-21 MED ORDER — HYDROXYZINE HCL 10 MG PO TABS: 10.0000 mg | ORAL_TABLET | Freq: Three times a day (TID) | ORAL | 0 refills | Status: DC | PRN

## 2019-11-21 MED ORDER — SUCRALFATE 1 GM/10ML PO SUSP: 1.0000 g | Freq: Three times a day (TID) | ORAL | 0 refills | Status: DC

## 2019-11-21 NOTE — Telephone Encounter (Signed)
Pharmacy called and is requesting to have clarification on the pts sucralfate. They state that the insurance is requesting to have PA for this medication. They state that there is a tablet that the pt can dissolve in water that is covered by insurance. The pt is okay with this switch. Please advise.      [facility] - [place], [place] - [address]  Renee Harder [place] [address]  Phone: [phone] Fax: [phone]  Hours: Not open 24 hours

## 2019-11-21 NOTE — Telephone Encounter (Signed)
We have already completed the PA and awaiting response. If denied we will change

## 2019-11-21 NOTE — Telephone Encounter (Signed)
Ok to change to dissolvable tablet? Please advise. Thanks!

## 2019-11-21 NOTE — Patient Instructions (Addendum)
Peptic Ulcer  A peptic ulcer is a painful sore in the lining of your stomach or the first part of your small intestine. What are the causes? Common causes of this condition include:  An infection.  Using certain pain medicines too often or too much. What increases the risk? You are more likely to get this condition if you:  Smoke.  Have a family history of ulcer disease.  Drink alcohol.  Have been hospitalized in an intensive care unit (ICU). What are the signs or symptoms? Symptoms include:  Burning pain in the area between the chest and the belly button. The pain may: ? Not go away (be persistent). ? Be worse when your stomach is empty. ? Be worse at night.  Heartburn.  Feeling sick to your stomach (nauseous) and throwing up (vomiting).  Bloating. If the ulcer results in bleeding, it can cause you to:  Have poop (stool) that is black and looks like tar.  Throw up bright red blood.  Throw up material that looks like coffee grounds. How is this treated? Treatment for this condition may include:  Stopping things that can cause the ulcer, such as: ? Smoking. ? Using pain medicines.  Medicines to reduce stomach acid.  Antibiotic medicines if the ulcer is caused by an infection.  A procedure that is done using a small, flexible tube that has a camera at the end (upper endoscopy). This may be done if you have a bleeding ulcer.  Surgery. This may be needed if: ? You have a lot of bleeding. ? The ulcer caused a hole somewhere in the digestive system. Follow these instructions at home:  Do not drink alcohol if your doctor tells you not to drink.  Limit how much caffeine you take in.  Do not use any products that contain nicotine or tobacco, such as cigarettes, e-cigarettes, and chewing tobacco. If you need help quitting, ask your doctor.  Take over-the-counter and prescription medicines only as told by your doctor. ? Do not stop or change your medicines unless  you talk with your doctor about it first. ? Do not take aspirin, ibuprofen, or other NSAIDs unless your doctor told you to do so.  Keep all follow-up visits as told by your doctor. This is important. Contact a doctor if:  You do not get better in 7 days after you start treatment.  You keep having an upset stomach (indigestion) or heartburn. Get help right away if:  You have sudden, sharp pain in your belly (abdomen).  You have belly pain that does not go away.  You have bloody poop (stool) or black, tarry poop.  You throw up blood. It may look like coffee grounds.  You feel light-headed or feel like you may pass out (faint).  You get weak.  You get sweaty or feel sticky and cold to the touch (clammy). Summary  Symptoms of a peptic ulcer include burning pain in the area between the chest and the belly button.  Take medicines only as told by your doctor.  Limit how much alcohol and caffeine you have.  Keep all follow-up visits as told by your doctor. This information is not intended to replace advice given to you by your health care provider. Make sure you discuss any questions you have with your health care provider. Document Revised: 06/28/2017 Document Reviewed: 06/28/2017 Elsevier Patient Education  2020 Elsevier Inc.   Hydroxyzine capsules or tablets What is this medicine? HYDROXYZINE (hye DROX i zeen) is an antihistamine. This  medicine is used to treat allergy symptoms. It is also used to treat anxiety and tension. This medicine can be used with other medicines to induce sleep before surgery. This medicine may be used for other purposes; ask your health care provider or pharmacist if you have questions. COMMON BRAND NAME(S): ANX, Atarax, Rezine, Vistaril What should I tell my health care provider before I take this medicine? They need to know if you have any of these conditions:  glaucoma  heart disease  history of irregular heartbeat  kidney disease  liver  disease  lung or breathing disease, like asthma  stomach or intestine problems  thyroid disease  trouble passing urine  an unusual or allergic reaction to hydroxyzine, cetirizine, other medicines, foods, dyes or preservatives  pregnant or trying to get pregnant  breast-feeding How should I use this medicine? Take this medicine by mouth with a full glass of water. Follow the directions on the prescription label. You may take this medicine with food or on an empty stomach. Take your medicine at regular intervals. Do not take your medicine more often than directed. Talk to your pediatrician regarding the use of this medicine in children. Special care may be needed. While this drug may be prescribed for children as young as 16 years of age for selected conditions, precautions do apply. Patients over 69 years old may have a stronger reaction and need a smaller dose. Overdosage: If you think you have taken too much of this medicine contact a poison control center or emergency room at once. NOTE: This medicine is only for you. Do not share this medicine with others. What if I miss a dose? If you miss a dose, take it as soon as you can. If it is almost time for your next dose, take only that dose. Do not take double or extra doses. What may interact with this medicine? Do not take this medicine with any of the following medications:  cisapride  dronedarone  pimozide  thioridazine This medicine may also interact with the following medications:  alcohol  antihistamines for allergy, cough, and cold  atropine  barbiturate medicines for sleep or seizures, like phenobarbital  certain antibiotics like erythromycin or clarithromycin  certain medicines for anxiety or sleep  certain medicines for bladder problems like oxybutynin, tolterodine  certain medicines for depression or psychotic disturbances  certain medicines for irregular heart beat  certain medicines for Parkinson's  disease like benztropine, trihexyphenidyl  certain medicines for seizures like phenobarbital, primidone  certain medicines for stomach problems like dicyclomine, hyoscyamine  certain medicines for travel sickness like scopolamine  ipratropium  narcotic medicines for pain  other medicines that prolong the QT interval (an abnormal heart rhythm) like dofetilide This list may not describe all possible interactions. Give your health care provider a list of all the medicines, herbs, non-prescription drugs, or dietary supplements you use. Also tell them if you smoke, drink alcohol, or use illegal drugs. Some items may interact with your medicine. What should I watch for while using this medicine? Tell your doctor or health care professional if your symptoms do not improve. You may get drowsy or dizzy. Do not drive, use machinery, or do anything that needs mental alertness until you know how this medicine affects you. Do not stand or sit up quickly, especially if you are an older patient. This reduces the risk of dizzy or fainting spells. Alcohol may interfere with the effect of this medicine. Avoid alcoholic drinks. Your mouth may get dry. Chewing  sugarless gum or sucking hard candy, and drinking plenty of water may help. Contact your doctor if the problem does not go away or is severe. This medicine may cause dry eyes and blurred vision. If you wear contact lenses you may feel some discomfort. Lubricating drops may help. See your eye doctor if the problem does not go away or is severe. If you are receiving skin tests for allergies, tell your doctor you are using this medicine. What side effects may I notice from receiving this medicine? Side effects that you should report to your doctor or health care professional as soon as possible:  allergic reactions like skin rash, itching or hives, swelling of the face, lips, or tongue  changes in vision  confusion  fast, irregular  heartbeat  seizures  tremor  trouble passing urine or change in the amount of urine Side effects that usually do not require medical attention (report to your doctor or health care professional if they continue or are bothersome):  constipation  drowsiness  dry mouth  headache  tiredness This list may not describe all possible side effects. Call your doctor for medical advice about side effects. You may report side effects to FDA at 1-800-FDA-1088. Where should I keep my medicine? Keep out of the reach of children. Store at room temperature between 15 and 30 degrees C (59 and 86 degrees F). Keep container tightly closed. Throw away any unused medicine after the expiration date. NOTE: This sheet is a summary. It may not cover all possible information. If you have questions about this medicine, talk to your doctor, pharmacist, or health care provider.  2020 Elsevier/Gold Standard (2017-12-12 13:19:55) Sucralfate oral suspension What is this medicine? SUCRALFATE (SOO kral fate) helps to treat ulcers of the intestine. This medicine may be used for other purposes; ask your health care provider or pharmacist if you have questions. COMMON BRAND NAME(S): Carafate What should I tell my health care provider before I take this medicine? They need to know if you have any of these conditions:  kidney disease  an unusual or allergic reaction to sucralfate, other medicines, foods, dyes, or preservatives  pregnant or trying to get pregnant  breast-feeding How should I use this medicine? Take this medicine by mouth with a glass of water. Follow the directions on the prescription label. Shake well before using. Use a specially marked spoon or container to measure your medicine. Ask your pharmacist if you do not have one. Household spoons are not accurate. This medicine works best if you take it on an empty stomach, 1 hour before meals. Take your doses at regular intervals. Do not take your  medicine more often than directed. Do not stop taking except on your doctor's advice. Talk to your pediatrician regarding the use of this medicine in children. Special care may be needed. Overdosage: If you think you have taken too much of this medicine contact a poison control center or emergency room at once. NOTE: This medicine is only for you. Do not share this medicine with others. What if I miss a dose? If you miss a dose, take it as soon as you can. If it is almost time for your next dose, take only that dose. Do not take double or extra doses. What may interact with this medicine?  antacid  cimetidine  digoxin  ketoconazole  phenytoin  quinidine  ranitidine  some antibiotics like ciprofloxacin, norfloxacin, and ofloxacin  theophylline  thyroid hormones  warfarin This list may not describe  all possible interactions. Give your health care provider a list of all the medicines, herbs, non-prescription drugs, or dietary supplements you use. Also tell them if you smoke, drink alcohol, or use illegal drugs. Some items may interact with your medicine. What should I watch for while using this medicine? Visit your doctor or health care professional for regular check ups. Let your doctor know if your symptoms do not improve or if you feel worse. Antacids should not be taken within one half hour before or after this medicine. What side effects may I notice from receiving this medicine? Side effects that you should report to your doctor or health care professional as soon as possible:  allergic reactions like skin rash, itching or hives, swelling of the face, lips, or tongue  difficulty breathing Side effects that usually do not require medical attention (report to your doctor or health care professional if they continue or are bothersome):  back pain  constipation  drowsy, dizzy  dry mouth  headache  stomach upset, gas  trouble sleeping This list may not describe all  possible side effects. Call your doctor for medical advice about side effects. You may report side effects to FDA at 1-800-FDA-1088. Where should I keep my medicine? Keep out of the reach of children. Store at room temperature between 20 and 25 degrees C (68 and 77 degrees F). Keep container tightly closed. Throw away any unused medicine after the expiration date. NOTE: This sheet is a summary. It may not cover all possible information. If you have questions about this medicine, talk to your doctor, pharmacist, or health care provider.  2020 Elsevier/Gold Standard (2007-08-23 15:47:18)

## 2019-11-21 NOTE — Telephone Encounter (Signed)
Sucralfate was approved.

## 2019-11-21 NOTE — Telephone Encounter (Signed)
Left message that her medication was approved. Pharmacy just need to run the prescription again.

## 2019-11-22 ENCOUNTER — Encounter: Payer: Self-pay | Admitting: Physician Assistant

## 2019-12-18 NOTE — Progress Notes (Signed)
Established patient visit   Patient: Kimberly Cameron   DOB: [birth year]   24 y.o. Female  MRN: [id number] Visit Date: 12/19/2019  Today's healthcare provider: Margaretann Loveless, PA-C   Chief Complaint  Patient presents with   Follow-up   Subjective    HPI  Follow up for GERD and PUD  The patient was last seen for this 4 weeks ago. Changes made at last visit include Will try Sucralfate1 GM/10ML suspension; Take 10 mLs (1 g total) by mouth 4 (four) times daily -  with meals and at bedtime .  She reports excellent compliance with treatment. She feels that condition is Improved. She is not having side effects.  She still noticed that if she is a little hungry, she still feels like she is starving and her stomach starts burning if she doesn't eat something quickly. When she eats she will then feel nauseous after eating.  ----------------------------------------------------------------------------------------- GAD, Follow-up  She was last seen for anxiety 4 weeks ago. Changes made at last visit include hydroxyzine as below for situational anxiety. May start with 1/2 tab and increase to 1 tab.    She reports excellent compliance with treatment. She reports good tolerance of treatment. She is not having side effects.   She feels her anxiety is moderate and unchanged since last visit   Symptoms: No chest pain Yes difficulty concentrating  No dizziness No fatigue  No feelings of losing control No insomnia  No irritable Yes palpitations  No panic attacks No racing thoughts  No shortness of breath No sweating  Yes tremors/shakes   Feeling sick to her stomach.   GAD-7 Results GAD-7 Generalized Anxiety Disorder Screening Tool 12/19/2019  1. Feeling Nervous, Anxious, or on Edge 3  2. Not Being Able to Stop or Control Worrying 3  3. Worrying Too Much About Different Things 1  4. Trouble Relaxing 1  5. Being So Restless it's Hard To Sit Still 0  6. Becoming Easily  Annoyed or Irritable 0  7. Feeling Afraid As If Something Awful Might Happen 0  Total GAD-7 Score 8  Difficulty At Work, Home, or Getting  Along With Others? 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     Medications: Outpatient Medications Prior to Visit  Medication Sig   hydrOXYzine (ATARAX/VISTARIL) 10 MG tablet Take 1 tablet (10 mg total) by mouth 3 (three) times daily as needed for anxiety.   sucralfate (CARAFATE) 1 GM/10ML suspension Take 10 mLs (1 g total) by mouth 4 (four) times daily -  with meals and at bedtime.   No facility-administered medications prior to visit.    Review of Systems  Constitutional: Negative.   Respiratory: Negative.   Cardiovascular: Negative.   Gastrointestinal: Positive for abdominal pain and nausea.  Psychiatric/Behavioral: Positive for agitation and dysphoric mood. The patient is nervous/anxious.     Last CBC Lab Results  Component Value Date   WBC 5.1 04/08/2017   HGB 14.0 04/08/2017   HCT 42.0 04/08/2017   MCV 92 04/08/2017   MCH 30.7 04/08/2017   RDW 13.2 04/08/2017   PLT 287 04/08/2017  Last metabolic panel Lab Results  Component Value Date   GLUCOSE 82 04/08/2017   NA 140 04/08/2017   K 4.6 04/08/2017   CL 103 04/08/2017   CO2 22 04/08/2017   BUN 7 04/08/2017   CREATININE 0.69 04/08/2017   GFRNONAA 124 04/08/2017   GFRAA 143 04/08/2017   CALCIUM 9.2 04/08/2017   PROT 6.6 04/08/2017   ALBUMIN 4.3 04/08/2017   LABGLOB 2.3 04/08/2017   AGRATIO 1.9 04/08/2017   BILITOT  0.7 04/08/2017   ALKPHOS 70 04/08/2017   AST 15 04/08/2017   ALT 11 04/08/2017      Objective    BP 115/74 (BP Location: Left Arm, Patient Position: Sitting, Cuff Size: Large)    Pulse 82    Temp 98.3 F (36.8 C) (Oral)    Resp 16    Wt 188 lb 3.2 oz (85.4 kg)    BMI 28.62 kg/m  BP Readings from Last 3 Encounters:  12/19/19 115/74  11/21/19 104/70  01/23/18 116/70   Wt Readings from Last 3 Encounters:  12/19/19 188 lb 3.2 oz (85.4 kg)  11/21/19 187 lb 3.2 oz (84.9 kg)  01/23/18 175 lb (79.4 kg)      Physical Exam Vitals reviewed.  Constitutional:      General: She is not in acute distress.    Appearance: Normal appearance. She is well-developed and well-nourished. She is not ill-appearing.  HENT:     Head: Normocephalic and atraumatic.  Eyes:     Extraocular Movements: EOM normal.  Pulmonary:     Effort: Pulmonary effort is normal. No respiratory distress.  Musculoskeletal:     Cervical back: Normal range of motion and neck supple.  Neurological:     Mental Status: She is alert.  Psychiatric:        Mood and Affect: Mood and affect and mood normal.        Behavior: Behavior normal.        Thought Content: Thought content normal.        Judgment: Judgment normal.      No results found for any visits on 12/19/19.  Assessment & Plan     1. Gastroesophageal reflux disease without esophagitis Improving. Continue Sucralfate but decrease to breakfast/supper/bedtime dosing. F/U in 4-6 weeks.  - sucralfate (CARAFATE) 1 GM/10ML suspension; Take 10 mLs (1 g total) by mouth 3 times/day as needed-between meals & bedtime.  Dispense: 1200 mL; Refill: 0  2. PUD (peptic ulcer disease) See above medical treatment plan. - sucralfate (CARAFATE) 1 GM/10ML suspension; Take 10 mLs (1 g total) by mouth 3 times/day as needed-between meals & bedtime.  Dispense: 1200 mL; Refill: 0  3. GAD (generalized anxiety disorder) Still not to goal. Add Lexapro 10mg  as below. Continue hydroxyzine  prn. F/U in 4-6 weeks.  - escitalopram (LEXAPRO) 10 MG tablet; Start with 1/2 tab PO q hs x 1 week, then increase to 1 tab PO q hs  Dispense: 30 tablet; Refill: 1   No follow-ups on file.      , PA-C, have reviewed all documentation for this visit. The documentation on 12/19/19 for the exam, diagnosis, procedures, and orders are all accurate and complete.   12/21/19   [phone] (phone) [phone] (fax)  [facility]

## 2019-12-19 ENCOUNTER — Other Ambulatory Visit: Payer: Self-pay

## 2019-12-19 ENCOUNTER — Encounter: Payer: Self-pay | Admitting: Physician Assistant

## 2019-12-19 ENCOUNTER — Ambulatory Visit (INDEPENDENT_AMBULATORY_CARE_PROVIDER_SITE_OTHER): Payer: 59 | Admitting: Physician Assistant

## 2019-12-19 VITALS — BP 115/74 | HR 82 | Temp 98.3°F | Resp 16 | Wt 188.2 lb

## 2019-12-19 DIAGNOSIS — F411 Generalized anxiety disorder: Secondary | ICD-10-CM

## 2019-12-19 DIAGNOSIS — K219 Gastro-esophageal reflux disease without esophagitis: Secondary | ICD-10-CM | POA: Diagnosis not present

## 2019-12-19 DIAGNOSIS — K279 Peptic ulcer, site unspecified, unspecified as acute or chronic, without hemorrhage or perforation: Secondary | ICD-10-CM | POA: Diagnosis not present

## 2019-12-19 MED ORDER — ESCITALOPRAM OXALATE 10 MG PO TABS: ORAL_TABLET | ORAL | 1 refills | Status: DC

## 2019-12-19 MED ORDER — SUCRALFATE 1 GM/10ML PO SUSP: 1.0000 g | Freq: Two times a day (BID) | ORAL | 0 refills | Status: DC | PRN

## 2019-12-19 NOTE — Patient Instructions (Signed)
Escitalopram tablets What is this medicine? ESCITALOPRAM (es sye TAL oh pram) is used to treat depression and certain types of anxiety. This medicine may be used for other purposes; ask your health care provider or pharmacist if you have questions. COMMON BRAND NAME(S): Lexapro What should I tell my health care provider before I take this medicine? They need to know if you have any of these conditions:  bipolar disorder or a family history of bipolar disorder  diabetes  glaucoma  heart disease  kidney or liver disease  receiving electroconvulsive therapy  seizures (convulsions)  suicidal thoughts, plans, or attempt by you or a family member  an unusual or allergic reaction to escitalopram, the related drug citalopram, other medicines, foods, dyes, or preservatives  pregnant or trying to become pregnant  breast-feeding How should I use this medicine? Take this medicine by mouth with a glass of water. Follow the directions on the prescription label. You can take it with or without food. If it upsets your stomach, take it with food. Take your medicine at regular intervals. Do not take it more often than directed. Do not stop taking this medicine suddenly except upon the advice of your doctor. Stopping this medicine too quickly may cause serious side effects or your condition may worsen. A special MedGuide will be given to you by the pharmacist with each prescription and refill. Be sure to read this information carefully each time. Talk to your pediatrician regarding the use of this medicine in children. Special care may be needed. Overdosage: If you think you have taken too much of this medicine contact a poison control center or emergency room at once. NOTE: This medicine is only for you. Do not share this medicine with others. What if I miss a dose? If you miss a dose, take it as soon as you can. If it is almost time for your next dose, take only that dose. Do not take double or  extra doses. What may interact with this medicine? Do not take this medicine with any of the following medications:  certain medicines for fungal infections like fluconazole, itraconazole, ketoconazole, posaconazole, voriconazole  cisapride  citalopram  dronedarone  linezolid  MAOIs like Carbex, Eldepryl, Marplan, Nardil, and Parnate  methylene blue (injected into a vein)  pimozide  thioridazine This medicine may also interact with the following medications:  alcohol  amphetamines  aspirin and aspirin-like medicines  carbamazepine  certain medicines for depression, anxiety, or psychotic disturbances  certain medicines for migraine headache like almotriptan, eletriptan, frovatriptan, naratriptan, rizatriptan, sumatriptan, zolmitriptan  certain medicines for sleep  certain medicines that treat or prevent blood clots like warfarin, enoxaparin, dalteparin  cimetidine  diuretics  dofetilide  fentanyl  furazolidone  isoniazid  lithium  metoprolol  NSAIDs, medicines for pain and inflammation, like ibuprofen or naproxen  other medicines that prolong the QT interval (cause an abnormal heart rhythm)  procarbazine  rasagiline  supplements like St. John's wort, kava kava, valerian  tramadol  tryptophan  ziprasidone This list may not describe all possible interactions. Give your health care provider a list of all the medicines, herbs, non-prescription drugs, or dietary supplements you use. Also tell them if you smoke, drink alcohol, or use illegal drugs. Some items may interact with your medicine. What should I watch for while using this medicine? Tell your doctor if your symptoms do not get better or if they get worse. Visit your doctor or health care professional for regular checks on your progress. Because it may   take several weeks to see the full effects of this medicine, it is important to continue your treatment as prescribed by your doctor. Patients  and their families should watch out for new or worsening thoughts of suicide or depression. Also watch out for sudden changes in feelings such as feeling anxious, agitated, panicky, irritable, hostile, aggressive, impulsive, severely restless, overly excited and hyperactive, or not being able to sleep. If this happens, especially at the beginning of treatment or after a change in dose, call your health care professional. You may get drowsy or dizzy. Do not drive, use machinery, or do anything that needs mental alertness until you know how this medicine affects you. Do not stand or sit up quickly, especially if you are an older patient. This reduces the risk of dizzy or fainting spells. Alcohol may interfere with the effect of this medicine. Avoid alcoholic drinks. Your mouth may get dry. Chewing sugarless gum or sucking hard candy, and drinking plenty of water may help. Contact your doctor if the problem does not go away or is severe. What side effects may I notice from receiving this medicine? Side effects that you should report to your doctor or health care professional as soon as possible:  allergic reactions like skin rash, itching or hives, swelling of the face, lips, or tongue  anxious  black, tarry stools  changes in vision  confusion  elevated mood, decreased need for sleep, racing thoughts, impulsive behavior  eye pain  fast, irregular heartbeat  feeling faint or lightheaded, falls  feeling agitated, angry, or irritable  hallucination, loss of contact with reality  loss of balance or coordination  loss of memory  painful or prolonged erections  restlessness, pacing, inability to keep still  seizures  stiff muscles  suicidal thoughts or other mood changes  trouble sleeping  unusual bleeding or bruising  unusually weak or tired  vomiting Side effects that usually do not require medical attention (report to your doctor or health care professional if they  continue or are bothersome):  changes in appetite  change in sex drive or performance  headache  increased sweating  indigestion, nausea  tremors This list may not describe all possible side effects. Call your doctor for medical advice about side effects. You may report side effects to FDA at 1-800-FDA-1088. Where should I keep my medicine? Keep out of reach of children. Store at room temperature between 15 and 30 degrees C (59 and 86 degrees F). Throw away any unused medicine after the expiration date. NOTE: This sheet is a summary. It may not cover all possible information. If you have questions about this medicine, talk to your doctor, pharmacist, or health care provider.  2020 Elsevier/Gold Standard (2017-12-12 11:21:44)  

## 2020-01-01 ENCOUNTER — Telehealth (INDEPENDENT_AMBULATORY_CARE_PROVIDER_SITE_OTHER): Payer: 59 | Admitting: Family Medicine

## 2020-01-01 ENCOUNTER — Encounter: Payer: Self-pay | Admitting: Family Medicine

## 2020-01-01 DIAGNOSIS — U071 COVID-19: Secondary | ICD-10-CM

## 2020-01-01 NOTE — Progress Notes (Signed)
Telephone Visit    Virtual Visit via Telephone Note   This visit type was conducted due to national recommendations for restrictions regarding the COVID-19 Pandemic (e.g. social distancing) in an effort to limit this patient's exposure and mitigate transmission in our community. This patient is at least at moderate risk for complications without adequate follow up. This format is felt to be most appropriate for this patient at this time. Physical exam was limited by quality of the audio technology used for the visit.   Patient location: Home Provider location: Office  I discussed the limitations of evaluation and management by telemedicine and the availability of in person appointments. The patient expressed understanding and agreed to proceed.  Patient: Kimberly Cameron   DOB: April 06, 1995   24 y.o. Female  MRN: [id number] Visit Date: 01/01/2020  Today's healthcare provider: Dortha Kern, PA-C   No chief complaint on file.  Subjective    HPI  Upper Respiratory Infection: Patient complains of symptoms of a URI. Symptoms include right ear pain. Onset of symptoms was 3 days ago, gradually worsening since that time. She also c/o congestion and headache described as typical for the past 3 days .  She is drinking plenty of fluids. Evaluation to date: none. Treatment to date: none.  Took at home test and tested pos, her family has covid, can she take anything before it gets really bad.   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 Family History  Problem Relation Age of Onset   Diabetes Father    Clotting disorder Brother    No Known Allergies    Medications: Outpatient Medications Prior  to Visit  Medication Sig   escitalopram (LEXAPRO) 10 MG tablet Start with 1/2 tab PO q hs x 1 week, then increase to 1 tab PO q hs   hydrOXYzine (ATARAX/VISTARIL) 10 MG tablet Take 1 tablet (10 mg total) by mouth 3 (three) times daily as needed for anxiety.   sucralfate (CARAFATE) 1 GM/10ML suspension Take 10 mLs (1 g total) by mouth 3 times/day as needed-between meals & bedtime.   No facility-administered medications prior to visit.    Review of Systems  Constitutional: Negative.   HENT: Positive for congestion, postnasal drip and sore throat.   Respiratory: Negative.   Cardiovascular: Negative.   Gastrointestinal: Negative.   Musculoskeletal: Negative.       Objective    There were no vitals taken for this visit.   Unable to connect for video conference. During telephonic interview, voice sounded raspy. No acute respiratory distress apparent. Oriented and cooperative.    Assessment & Plan     1. COVID-19 virus infection Developed sore throat, PND, slight headache and raspy voice on 12-30-19. Most of her family has tested positive for COVID-19. She tested yesterday and had a positive results. She go the J&J vaccination in August 2021. She denies fever or loss of taste/smell and no cough or dyspnea. Recommend she maintain isolation for 7-10 days from onset of symptoms and use Mucinex-DM, Zinc lozenges, Vitamin C and Tylenol or Advil for aches and pains. Call or go to Urgent Care if symptoms or fever worsen.   No follow-ups on file.  I discussed the assessment and treatment plan with the patient. The patient was provided an opportunity to ask questions and all were answered. The patient agreed with the plan and demonstrated an understanding of the instructions.   The patient was advised to call back or seek an in-person evaluation if the symptoms worsen or if the condition fails to improve as anticipated.  I provided 20 minutes of non-face-to-face time during this  encounter.  I, Maykel Reitter, PA-C, have reviewed all documentation for this visit. The documentation on 01/01/20 for the exam, diagnosis, procedures, and orders are all accurate and complete.   Dortha Kern, PA-C [employer] [phone] (phone) [phone] (fax)  [facility]

## 2020-01-16 ENCOUNTER — Ambulatory Visit: Payer: Self-pay | Admitting: *Deleted

## 2020-01-16 NOTE — Telephone Encounter (Signed)
Since she has had covid 12/30/2019 she has been experiencing abdominal pain. Her stomach feels like she is drinking rubbing alcohol she states. Waited for triage  Called placed to review symptoms, no answer, left message to call clinic back.

## 2020-01-16 NOTE — Telephone Encounter (Signed)
Patient called stating that she was Dx with stomach ulcer and placed on medication several months ago. She states she has just had COVID-19 and feel that her stomach symptoms have worsened. She states it feel like she is hungry all the time with burning in her stomach.  She has been prescribed medication which she feel helps some  She has also been told to take pepcid if needed but to space from her liquid medication. She has a sore throat lingering from COVID. Per protocol patient was scheduled for virtual visit. She will call back if symptoms worsen. Reason for Disposition . Abdominal pain is a chronic symptom (recurrent or ongoing AND present > 4 weeks)  Answer Assessment - Initial Assessment Questions 1. LOCATION: "Where does it hurt?"      Entire stomach burns 2. RADIATION: "Does the pain shoot anywhere else?" (e.g., chest, back)     no 3. ONSET: "When did the pain begin?" (e.g., minutes, hours or days ago)     Been being treated for stomach ulcer 4. SUDDEN: "Gradual or sudden onset?"     Sudden just wakes up with it 5. PATTERN "Does the pain come and go, or is it constant?"    - If constant: "Is it getting better, staying the same, or worsening?"      (Note: Constant means the pain never goes away completely; most serious pain is constant and it progresses)     - If intermittent: "How long does it last?" "Do you have pain now?"     (Note: Intermittent means the pain goes away completely between bouts)     Constant for a day 6. SEVERITY: "How bad is the pain?"  (e.g., Scale 1-10; mild, moderate, or severe)   - MILD (1-3): doesn't interfere with normal activities, abdomen soft and not tender to touch    - MODERATE (4-7): interferes with normal activities or awakens from sleep, tender to touch    - SEVERE (8-10): excruciating pain, doubled over, unable to do any normal activities      5 7. RECURRENT SYMPTOM: "Have you ever had this type of stomach pain before?" If Yes, ask: "When was the  last time?" and "What happened that time?"     Yes dx with ulcer 8. CAUSE: "What do you think is causing the stomach pain?"     Stomach ulcer 9. RELIEVING/AGGRAVATING FACTORS: "What makes it better or worse?" (e.g., movement, antacids, bowel movement)     Antacid will help pepcid 10. OTHER SYMPTOMS: "Has there been any vomiting, diarrhea, constipation, or urine problems?"       no 11. PREGNANCY: "Is there any chance you are pregnant?" "When was your last menstrual period?"       No last week menses  Protocols used: ABDOMINAL PAIN - [facility]

## 2020-01-18 ENCOUNTER — Telehealth (INDEPENDENT_AMBULATORY_CARE_PROVIDER_SITE_OTHER): Payer: Self-pay | Admitting: Family Medicine

## 2020-01-18 ENCOUNTER — Encounter: Payer: Self-pay | Admitting: Family Medicine

## 2020-01-18 DIAGNOSIS — K279 Peptic ulcer, site unspecified, unspecified as acute or chronic, without hemorrhage or perforation: Secondary | ICD-10-CM

## 2020-01-18 DIAGNOSIS — K219 Gastro-esophageal reflux disease without esophagitis: Secondary | ICD-10-CM

## 2020-01-18 MED ORDER — PANTOPRAZOLE SODIUM 40 MG PO TBEC: 40.0000 mg | DELAYED_RELEASE_TABLET | Freq: Every day | ORAL | 3 refills | Status: DC

## 2020-01-18 NOTE — Progress Notes (Signed)
MyChart Video Visit    Virtual Visit via Video Note   This visit type was conducted due to national recommendations for restrictions regarding the COVID-19 Pandemic (e.g. social distancing) in an effort to limit this patient's exposure and mitigate transmission in our community. This patient is at least at moderate risk for complications without adequate follow up. This format is felt to be most appropriate for this patient at this time. Physical exam was limited by quality of the video and audio technology used for the visit.   Patient location: Home Provider location: Office  I discussed the limitations of evaluation and management by telemedicine and the availability of in person appointments. The patient expressed understanding and agreed to proceed.  Patient: Kimberly Cameron   DOB: 10-12-1995   24 y.o. Female  MRN: [id number] Visit Date: 01/18/2020  Today's healthcare provider: Dortha Kern, PA-C   No chief complaint on file.  Subjective    HPI  This 25 year old female has had worsening dyspepsia and reflux since having a COVID infection  12-30-19. No hematemesis or melena. Has been on Carafate suspension for a long time with little relief.   Patient Active Problem List   Diagnosis Date Noted  . Muscle weakness (generalized) 01/23/2018  . Deviated nasal septum 04/21/2016  . Gastroesophageal reflux disease 04/21/2016  . Chronic sinusitis 04/21/2016  . 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 TONSILLECTOMY     in 7th grade   Family History  Problem Relation Age of Onset  . Diabetes Father   . Clotting disorder Brother     No past medical history on file. Social History   Tobacco Use  . Smoking status: Never Smoker  . Smokeless tobacco: Never Used  Substance Use Topics  . Alcohol use: Yes  . Drug use: No   No Known Allergies  Medications: Outpatient Medications Prior to Visit  Medication Sig  .  escitalopram (LEXAPRO) 10 MG tablet Start with 1/2 tab PO q hs x 1 week, then increase to 1 tab PO q hs  . hydrOXYzine (ATARAX/VISTARIL) 10 MG tablet Take 1 tablet (10 mg total) by mouth 3 (three) times daily as needed for anxiety.  . sucralfate (CARAFATE) 1 GM/10ML suspension Take 10 mLs (1 g total) by mouth 3 times/day as needed-between meals & bedtime.   No facility-administered medications prior to visit.    Review of Systems  Constitutional: Negative.   HENT: Negative.   Respiratory: Negative.   Cardiovascular: Negative.   Gastrointestinal: Negative for blood in stool.       Dyspepsia and reflux symptoms.      Objective    There were no vitals taken for this visit.   Physical Exam: WDWN female in no apparent distress.  Head: Normocephalic, atraumatic. Neck: Supple, NROM Respiratory: No apparent distress Psych: Normal mood and affect      Assessment & Plan     1. Gastroesophageal reflux disease, unspecified whether esophagitis present Worsening since having positive COVID test in December 2021. Not much relief from the Carafate (worried the suspension is not giving her the full dose). Will check H.pylori test and add Protonix. May need referral to gastroenterologist if no better. - H. pylori breath test; Future - pantoprazole (PROTONIX) 40 MG tablet; Take 1 tablet (40 mg total) by mouth daily.  Dispense: 30 tablet; Refill: 3  2. PUD (peptic ulcer disease) History of ulcer for the past couple of years. Not  much help from Carafate or Pepcid. Will treat with Protonix for a month and check H. Pylori test. - H. pylori breath test; Future - pantoprazole (PROTONIX) 40 MG tablet; Take 1 tablet (40 mg total) by mouth daily.  Dispense: 30 tablet; Refill: 3    No follow-ups on file.     I discussed the assessment and treatment plan with the patient. The patient was provided an opportunity to ask questions and all were answered. The patient agreed with the plan and  demonstrated an understanding of the instructions.   The patient was advised to call back or seek an in-person evaluation if the symptoms worsen or if the condition fails to improve as anticipated.  I provided 20 minutes of non-face-to-face time during this encounter.  I, Emmer Lillibridge, PA-C, have reviewed all documentation for this visit. The documentation on 01/18/20 for the exam, diagnosis, procedures, and orders are all accurate and complete.   Dortha Kern, PA-C [facility] [phone] (phone) [phone] (fax)  [facility]

## 2020-01-21 ENCOUNTER — Telehealth: Payer: Self-pay | Admitting: Physician Assistant

## 2020-01-23 ENCOUNTER — Other Ambulatory Visit: Payer: Self-pay

## 2020-01-23 DIAGNOSIS — K219 Gastro-esophageal reflux disease without esophagitis: Secondary | ICD-10-CM

## 2020-01-23 DIAGNOSIS — K279 Peptic ulcer, site unspecified, unspecified as acute or chronic, without hemorrhage or perforation: Secondary | ICD-10-CM

## 2020-01-25 ENCOUNTER — Ambulatory Visit: Payer: Self-pay | Admitting: Physician Assistant

## 2020-02-10 LAB — H. PYLORI BREATH TEST: H pylori Breath Test: NEGATIVE

## 2020-03-17 ENCOUNTER — Other Ambulatory Visit: Payer: Self-pay | Admitting: Physician Assistant

## 2020-03-17 DIAGNOSIS — F411 Generalized anxiety disorder: Secondary | ICD-10-CM

## 2020-03-17 NOTE — Telephone Encounter (Signed)
   Notes to clinic:  Patient due for follow  up 4-6 weeks  Last appt was canceled on 01/25/2020 Review for refill    Requested Prescriptions  Pending Prescriptions Disp Refills   escitalopram (LEXAPRO) 10 MG tablet [Pharmacy Med Name: ESCITALOPRAM OXALATE 10 MG TAB] 30 tablet 1    Sig: START WITH 1/2 TABLET BY MOUTH AT BEDTIME FOR ONE WEEK, THEN INCREASE TO 1TABLET AT BEDTIME      Psychiatry:  Antidepressants - SSRI Passed - 03/17/2020  2:39 PM      Passed - Valid encounter within last 6 months    Recent Outpatient Visits           1 month ago Gastroesophageal reflux disease, unspecified whether esophagitis present   [facility], Jodell Cipro, PA-C   2 months ago COVID-19 virus infection   [facility], Jodell Cipro, PA-C   2 months ago GAD (generalized anxiety disorder)   [facility], [place], [place]   3 months ago Gastroesophageal reflux disease without esophagitis   [facility], Alessandra Bevels, [place]   2 years ago Other fatigue   [facility] [place], Nyoka Cowden, [place]

## 2020-04-15 ENCOUNTER — Other Ambulatory Visit: Payer: Self-pay | Admitting: Family Medicine

## 2020-04-15 DIAGNOSIS — K279 Peptic ulcer, site unspecified, unspecified as acute or chronic, without hemorrhage or perforation: Secondary | ICD-10-CM

## 2020-04-15 DIAGNOSIS — K219 Gastro-esophageal reflux disease without esophagitis: Secondary | ICD-10-CM

## 2020-04-24 ENCOUNTER — Telehealth: Payer: Self-pay | Admitting: Physician Assistant

## 2020-04-24 NOTE — Telephone Encounter (Signed)
Patient advised as below.  

## 2020-04-24 NOTE — Telephone Encounter (Signed)
She would need a visit with someone to discuss his options.  This is the most appropriate plan with the situation

## 2020-04-24 NOTE — Telephone Encounter (Signed)
Patient would like to speak with the nurse or doctor regarding her medication for escitalopram (LEXAPRO) 10 MG tablet.  She stated she thinks it is making her feel unmotivated and down and is concerned because she has finals coming up and does not have any motivation.  Please advise and call patient to discuss at [phone]

## 2020-04-24 NOTE — Telephone Encounter (Signed)
Please check,I would increase to 1.5 daily for 2 weeks then 2 daily but it will take a few weeks to work.

## 2020-04-24 NOTE — Telephone Encounter (Signed)
Advised patient as below. Patient feels that the Lexapro is helping her anxiety, but it is making her too calm. Patient is willing to try the treatment below. However, she is wondering if she needs to try something different or add a medication to help with her motivation? Please advise. Thanks!

## 2020-04-25 ENCOUNTER — Other Ambulatory Visit: Payer: Self-pay

## 2020-04-25 ENCOUNTER — Ambulatory Visit (INDEPENDENT_AMBULATORY_CARE_PROVIDER_SITE_OTHER): Payer: 59 | Admitting: Family Medicine

## 2020-04-25 ENCOUNTER — Encounter: Payer: Self-pay | Admitting: Family Medicine

## 2020-04-25 VITALS — BP 100/66 | HR 70 | Temp 98.1°F | Resp 16 | Wt 190.0 lb

## 2020-04-25 DIAGNOSIS — F339 Major depressive disorder, recurrent, unspecified: Secondary | ICD-10-CM

## 2020-04-25 DIAGNOSIS — R5383 Other fatigue: Secondary | ICD-10-CM

## 2020-04-25 DIAGNOSIS — F411 Generalized anxiety disorder: Secondary | ICD-10-CM | POA: Diagnosis not present

## 2020-04-25 DIAGNOSIS — Z79899 Other long term (current) drug therapy: Secondary | ICD-10-CM

## 2020-04-25 DIAGNOSIS — K279 Peptic ulcer, site unspecified, unspecified as acute or chronic, without hemorrhage or perforation: Secondary | ICD-10-CM

## 2020-04-25 MED ORDER — BUPROPION HCL ER (XL) 150 MG PO TB24: 150.0000 mg | ORAL_TABLET | Freq: Every day | ORAL | 1 refills | Status: DC

## 2020-04-25 NOTE — Progress Notes (Signed)
I,April Miller,acting as a scribe for Megan Mans, MD.,have documented all relevant documentation on the behalf of Megan Mans, MD,as directed by  Megan Mans, MD while in the presence of Megan Mans, MD.   Established patient visit   Patient: Kimberly Cameron   DOB: [birth year]   24 y.o. Female  MRN: [id number] Visit Date: 04/25/2020  Today's healthcare provider: Megan Mans, MD   Chief Complaint  Patient presents with  . Follow-up  . Anxiety   Subjective    HPI  Patient's main complaint is lack of motivation.  She has no "get up and go".  She has no way suicidal homicidal.  She  wants to feel better. Anxiety, Follow-up  She was last seen for anxiety 4 months ago. Changes made at last visit include; patient called office 04/24/2020, stating she thinks Lexapro is making her feel unmotivated and down. Advised to increase to 1.5 daily for 2 weeks then 2 daily. Advised it will take a few weeks to work. Patient feels that the Lexapro is helping her anxiety, but it is making her too calm. Patient is willing to try the treatment advised. However, she is wondering if she needs to try something different or add a medication to help with her motivation?    She reports good compliance with treatment. She reports fair tolerance of treatment. She is not having side effects. unsure  She feels her anxiety is moderate and Unchanged since last visit.  Symptoms: No chest pain Yes difficulty concentrating  No dizziness Yes fatigue  No feelings of losing control No insomnia  No irritable No palpitations  No panic attacks No racing thoughts  No shortness of breath No sweating  No tremors/shakes    GAD-7 Results GAD-7 Generalized Anxiety Disorder Screening Tool 04/25/2020 12/19/2019  1. Feeling Nervous, Anxious, or on Edge 1 3  2. Not Being Able to Stop or Control Worrying 1 3  3. Worrying Too Much About Different Things 1 1  4. Trouble Relaxing 2 1  5.  Being So Restless it's Hard To Sit Still 0 0  6. Becoming Easily Annoyed or Irritable 1 0  7. Feeling Afraid As If Something Awful Might Happen 0 0  Total GAD-7 Score 6 8  Difficulty At Work, Home, or Getting  Along With Others? Very difficult Not difficult at all    PHQ-9 Scores PHQ9 SCORE ONLY 04/25/2020 12/19/2019 11/21/2019  PHQ-9 Total Score 11 2 6     --------------------------------------------------------------------       Medications: Outpatient Medications Prior to Visit  Medication Sig  . escitalopram (LEXAPRO) 10 MG tablet Take 1 tablet (10 mg total) by mouth at bedtime.  . pantoprazole (PROTONIX) 40 MG tablet TAKE 1 TABLET BY MOUTH EVERY DAY  . hydrOXYzine (ATARAX/VISTARIL) 10 MG tablet Take 1 tablet (10 mg total) by mouth 3 (three) times daily as needed for anxiety. (Patient not taking: Reported on 04/25/2020)  . sucralfate (CARAFATE) 1 GM/10ML suspension Take 10 mLs (1 g total) by mouth 3 times/day as needed-between meals & bedtime. (Patient not taking: Reported on 04/25/2020)   No facility-administered medications prior to visit.    Review of Systems      Objective    BP 100/66 (BP Location: Right Arm, Patient Position: Sitting, Cuff Size: Large)   Pulse 70   Temp 98.1 F (36.7 C) (Oral)   Resp 16   Wt 190 lb (86.2 kg)   SpO2 97%   BMI 28.89  kg/m      Physical Exam Vitals reviewed.  Constitutional:      General: She is not in acute distress.    Appearance: Normal appearance. She is well-developed. She is not ill-appearing.  HENT:     Head: Normocephalic and atraumatic.  Pulmonary:     Effort: Pulmonary effort is normal. No respiratory distress.  Musculoskeletal:     Cervical back: Normal range of motion and neck supple.  Skin:    General: Skin is warm and dry.  Neurological:     General: No focal deficit present.     Mental Status: She is alert and oriented to person, place, and time.  Psychiatric:        Mood and Affect: Mood normal.         Behavior: Behavior normal.        Thought Content: Thought content normal.        Judgment: Judgment normal.       No results found for any visits on 04/25/20.  Assessment & Plan     1. PUD (peptic ulcer disease) Improved on pantoprazole and Carafate.  Continue meds - CBC w/Diff/Platelet - TSH - Comprehensive Metabolic Panel (CMET) - Vitamin B12 - Folate  2. GAD (generalized anxiety disorder) Bupropion XL 150 daily.  Continue Lexapro.  Up in 1 month. - CBC w/Diff/Platelet - TSH - Comprehensive Metabolic Panel (CMET) - Vitamin B12 - Folate - buPROPion (WELLBUTRIN XL) 150 MG 24 hr tablet; Take 1 tablet (150 mg total) by mouth daily.  Dispense: 30 tablet; Refill: 1  3. Depression, recurrent (HCC)  - CBC w/Diff/Platelet - TSH - Comprehensive Metabolic Panel (CMET) - Vitamin B12 - Folate - buPROPion (WELLBUTRIN XL) 150 MG 24 hr tablet; Take 1 tablet (150 mg total) by mouth daily.  Dispense: 30 tablet; Refill: 1  4. Fatigue, unspecified type  - CBC w/Diff/Platelet - TSH - Comprehensive Metabolic Panel (CMET) - Vitamin B12 - Folate  5. High risk medication use Pantoprazole check for B12 deficiency. - CBC w/Diff/Platelet - TSH - Comprehensive Metabolic Panel (CMET) - Vitamin B12 - Folate   Return in about 1 month (around 05/25/2020).         Peyson Delao Wendelyn Breslow, MD  [facility] [phone] (phone) [phone] (fax)  [facility]

## 2020-04-30 ENCOUNTER — Telehealth: Payer: Self-pay

## 2020-04-30 NOTE — Telephone Encounter (Signed)
Copied from CRM [id number]. Topic: General - Other >> Apr 30, 2020 11:22 AM Gaetana Michaelis A wrote: Reason for CRM: Patient has been taking Dexmethylphenidate 5 MG tablets irregularly (it was previously prescribed by their pediatrician) and would like to know if there are any chances of drug interference/reactions with their new prescription for buPROPion (WELLBUTRIN XL) 150 MG 24 hr tablet   Patient took a half tablet of focalin this morning 04/30/20  Patient is experiencing no symptoms or issues at the moment. They would like to know for peace of mind  Please contact to further advise when possible

## 2020-05-01 NOTE — Telephone Encounter (Signed)
LMTCB 05/01/2020.  PEC please advise pt as below when she calls back.  Thanks,   -Vernona Rieger

## 2020-05-01 NOTE — Telephone Encounter (Signed)
Ok to take these together. May increase incidence of anxiety.

## 2020-05-02 NOTE — Telephone Encounter (Signed)
Patient's mother advised as per DPR.

## 2020-05-18 ENCOUNTER — Other Ambulatory Visit: Payer: Self-pay | Admitting: Family Medicine

## 2020-05-18 DIAGNOSIS — F411 Generalized anxiety disorder: Secondary | ICD-10-CM

## 2020-05-18 DIAGNOSIS — F339 Major depressive disorder, recurrent, unspecified: Secondary | ICD-10-CM

## 2020-05-29 ENCOUNTER — Encounter: Payer: Self-pay | Admitting: Family Medicine

## 2020-05-29 ENCOUNTER — Ambulatory Visit (INDEPENDENT_AMBULATORY_CARE_PROVIDER_SITE_OTHER): Payer: 59 | Admitting: Family Medicine

## 2020-05-29 ENCOUNTER — Other Ambulatory Visit: Payer: Self-pay

## 2020-05-29 VITALS — BP 107/62 | HR 64 | Wt 189.0 lb

## 2020-05-29 DIAGNOSIS — K279 Peptic ulcer, site unspecified, unspecified as acute or chronic, without hemorrhage or perforation: Secondary | ICD-10-CM

## 2020-05-29 DIAGNOSIS — F411 Generalized anxiety disorder: Secondary | ICD-10-CM

## 2020-05-29 NOTE — Progress Notes (Signed)
Established patient visit   Patient: Kimberly Cameron   DOB: [birth year]   25 y.o. Female  MRN: [id number] Visit Date: 05/29/2020  Today's healthcare provider: Dortha Kern, PA-C   Chief Complaint  Patient presents with  . Anxiety   Subjective    HPI  Follow up for PUD  The patient was last seen for this 1 months ago. Changes made at last visit include no changes.  She reports excellent compliance with treatment. She feels that condition is Unchanged. She is not having side effects.   ----------------------------------------------------------------------------------------- Follow up for Anxiety and Depression  The patient was last seen for this 1 months ago. Changes made at last visit include adding bupropion.  She reports excellent compliance with treatment. She feels that condition is Improved but only mildly.  She is not having side effects.   -----------------------------------------------------------------------------------------    Patient Active Problem List   Diagnosis Date Noted  . Muscle weakness (generalized) 01/23/2018  . Deviated nasal septum 04/21/2016  . Gastroesophageal reflux disease 04/21/2016  . Chronic sinusitis 04/21/2016  . 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   Family History  Problem Relation Age of Onset  . Diabetes Father   . Clotting disorder Brother     Social History   Tobacco Use  . Smoking status: Never Smoker  . Smokeless tobacco: Never Used  Substance Use Topics  . Alcohol use: Yes  . Drug use: No   No Known Allergies   Medications: Outpatient Medications Prior to Visit  Medication Sig  . amoxicillin-clavulanate (AUGMENTIN) 250-62.5 MG/5ML suspension SMARTSIG:10 Milliliter(s) By Mouth Every 8 Hours  . benzonatate (TESSALON) 100 MG capsule Take 100 mg by mouth 3 (three) times daily as needed.  . brompheniramine-pseudoephedrine-DM 30-2-10 MG/5ML syrup Take 10 mLs by mouth  every 4 (four) hours as needed.  Marland Kitchen buPROPion (WELLBUTRIN XL) 150 MG 24 hr tablet Take 1 tablet (150 mg total) by mouth daily.  Marland Kitchen escitalopram (LEXAPRO) 10 MG tablet Take 1 tablet (10 mg total) by mouth at bedtime.  . hydrOXYzine (ATARAX/VISTARIL) 10 MG tablet Take 1 tablet (10 mg total) by mouth 3 (three) times daily as needed for anxiety.  . pantoprazole (PROTONIX) 40 MG tablet TAKE 1 TABLET BY MOUTH EVERY DAY  . predniSONE (DELTASONE) 20 MG tablet Take by mouth.  . sucralfate (CARAFATE) 1 GM/10ML suspension Take 10 mLs (1 g total) by mouth 3 times/day as needed-between meals & bedtime.   No facility-administered medications prior to visit.    Review of Systems  Constitutional: Negative.  Negative for activity change, appetite change, chills, diaphoresis, fatigue, fever and unexpected weight change.  Respiratory: Positive for cough.   Cardiovascular: Negative.   Gastrointestinal: Negative.   Psychiatric/Behavioral: Negative for agitation, behavioral problems, confusion, decreased concentration, dysphoric mood, hallucinations, self-injury, sleep disturbance and suicidal ideas. The patient is nervous/anxious. The patient is not hyperactive.      Objective    BP 107/62 (BP Location: Right Arm, Patient Position: Sitting, Cuff Size: Large)   Pulse 64   Wt 189 lb (85.7 kg)   BMI 28.74 kg/m     Physical Exam Constitutional:      General: She is not in acute distress.    Appearance: She is well-developed.  HENT:     Head: Normocephalic and atraumatic.     Right Ear: Hearing and tympanic membrane normal.     Left Ear: Hearing and tympanic membrane normal.     Nose:  Nose normal.     Mouth/Throat:     Pharynx: Oropharynx is clear.  Eyes:     General: Lids are normal. No scleral icterus.       Right eye: No discharge.        Left eye: No discharge.     Conjunctiva/sclera: Conjunctivae normal.  Cardiovascular:     Rate and Rhythm: Normal rate and regular rhythm.  Pulmonary:      Effort: Pulmonary effort is normal. No respiratory distress.     Breath sounds: Normal breath sounds.  Abdominal:     General: Bowel sounds are normal.     Palpations: Abdomen is soft.  Musculoskeletal:        General: Normal range of motion.     Cervical back: Normal range of motion and neck supple.  Skin:    Findings: No lesion or rash.  Neurological:     Mental Status: She is alert and oriented to person, place, and time.  Psychiatric:        Speech: Speech normal.        Behavior: Behavior normal.        Thought Content: Thought content normal.       No results found for any visits on 05/29/20.  Assessment & Plan     1. GAD (generalized anxiety disorder) Wellbutrin working well to maintain control of anxiety. Has been taking 1/2 tablet of the Lexapro 10 mg each evening. Finds this causes more difficulty getting up/awake in the mornings. Recommend she continue the Wellbutrin-XL 150 mg qd and taper off the Lexapro. Proceed with labs that were ordered last month.  2. PUD (peptic ulcer disease) Much improved control of gastric fullness and dyspepsia. No melena or hematemesis. Carafate and Protonix working well. States H.pylori test was negative in Feb. 2022. Recommend she proceed with labs ordered on 04-25-20. May need to consider evaluation by a gastroenterologist if symptoms return or not fully controlled.   No follow-ups on file.      I, Cotina Freedman, PA-C, have reviewed all documentation for this visit. The documentation on 05/29/20 for the exam, diagnosis, procedures, and orders are all accurate and complete.    Dortha Kern, PA-C  [facility] [employer] [facility] [phone] (phone) [phone] (fax)  [facility]

## 2020-05-30 LAB — CBC WITH DIFFERENTIAL/PLATELET
Basophils Absolute: 0.1 10*3/uL (ref 0.0–0.2)
Basos: 1 %
EOS (ABSOLUTE): 0.1 10*3/uL (ref 0.0–0.4)
Eos: 1 %
Hematocrit: 42.5 % (ref 34.0–46.6)
Hemoglobin: 13.9 g/dL (ref 11.1–15.9)
Immature Grans (Abs): 0.1 10*3/uL (ref 0.0–0.1)
Immature Granulocytes: 1 %
Lymphocytes Absolute: 2.4 10*3/uL (ref 0.7–3.1)
Lymphs: 28 %
MCH: 29.6 pg (ref 26.6–33.0)
MCHC: 32.7 g/dL (ref 31.5–35.7)
MCV: 90 fL (ref 79–97)
Monocytes Absolute: 0.8 10*3/uL (ref 0.1–0.9)
Monocytes: 9 %
Neutrophils Absolute: 5.2 10*3/uL (ref 1.4–7.0)
Neutrophils: 60 %
Platelets: 372 10*3/uL (ref 150–450)
RBC: 4.7 x10E6/uL (ref 3.77–5.28)
RDW: 12 % (ref 11.7–15.4)
WBC: 8.6 10*3/uL (ref 3.4–10.8)

## 2020-05-30 LAB — COMPREHENSIVE METABOLIC PANEL
ALT: 18 IU/L (ref 0–32)
AST: 14 IU/L (ref 0–40)
Albumin/Globulin Ratio: 1.6 (ref 1.2–2.2)
Albumin: 4.2 g/dL (ref 3.9–5.0)
Alkaline Phosphatase: 87 IU/L (ref 44–121)
BUN/Creatinine Ratio: 10 (ref 9–23)
BUN: 8 mg/dL (ref 6–20)
Bilirubin Total: 0.2 mg/dL (ref 0.0–1.2)
CO2: 23 mmol/L (ref 20–29)
Calcium: 9.4 mg/dL (ref 8.7–10.2)
Chloride: 100 mmol/L (ref 96–106)
Creatinine, Ser: 0.82 mg/dL (ref 0.57–1.00)
Globulin, Total: 2.7 g/dL (ref 1.5–4.5)
Glucose: 86 mg/dL (ref 65–99)
Potassium: 3.7 mmol/L (ref 3.5–5.2)
Sodium: 141 mmol/L (ref 134–144)
Total Protein: 6.9 g/dL (ref 6.0–8.5)
eGFR: 102 mL/min/{1.73_m2} (ref >59)

## 2020-05-30 LAB — VITAMIN B12: Vitamin B-12: 619 pg/mL (ref 232–1245)

## 2020-05-30 LAB — TSH: TSH: 2.43 u[IU]/mL (ref 0.450–4.500)

## 2020-05-30 LAB — FOLATE: Folate: 13.7 ng/mL (ref >3.0)

## 2020-06-20 ENCOUNTER — Other Ambulatory Visit: Payer: Self-pay | Admitting: Family Medicine

## 2020-06-20 DIAGNOSIS — F339 Major depressive disorder, recurrent, unspecified: Secondary | ICD-10-CM

## 2020-06-20 DIAGNOSIS — F411 Generalized anxiety disorder: Secondary | ICD-10-CM

## 2020-07-16 ENCOUNTER — Other Ambulatory Visit: Payer: Self-pay | Admitting: Family Medicine

## 2020-07-16 DIAGNOSIS — K219 Gastro-esophageal reflux disease without esophagitis: Secondary | ICD-10-CM

## 2020-07-16 DIAGNOSIS — K279 Peptic ulcer, site unspecified, unspecified as acute or chronic, without hemorrhage or perforation: Secondary | ICD-10-CM

## 2020-07-21 ENCOUNTER — Telehealth: Payer: Self-pay

## 2020-07-21 NOTE — Telephone Encounter (Signed)
Copied from CRM [id number]. Topic: General - Inquiry >> Jul 21, 2020  1:00 PM Kimberly Cameron D wrote: Reason for CRM:  Pt called saying the past two days she has been having issues from her medication for anxiety.  She states the Wellbutrin didn't digest in her stomach because when she had a bowel movement the pill was whole in her BM.  CB#  [phone]

## 2020-07-22 ENCOUNTER — Other Ambulatory Visit: Payer: Self-pay | Admitting: Family Medicine

## 2020-07-22 DIAGNOSIS — F339 Major depressive disorder, recurrent, unspecified: Secondary | ICD-10-CM

## 2020-07-22 DIAGNOSIS — F411 Generalized anxiety disorder: Secondary | ICD-10-CM

## 2020-07-22 NOTE — Telephone Encounter (Signed)
LMTCB please have PEC nurse triage advise patient of message. KW

## 2020-07-22 NOTE — Telephone Encounter (Signed)
It's rare for a pill to pass through entire GI tract without breaking down. One dose like this would be ok though.  Would continue to take.

## 2020-07-22 NOTE — Telephone Encounter (Signed)
Patient returned call: Notified of PCP response regarding Wellbutrin and patient has continued to take.  Patient states she restarted her Lexapro 2 days ago due to her increased anxiety. She has stopped it previously due to SE- hard to wake up in the morning- feeling sluggish all day.   Patient also states she stopped her pantoprazole 2 months ago- she states her stomach has been fine- but the burning did start 1 week ago- she took 1 dose Sunday and it did seem to help Monday- she has not continued the medication. Patient states she was finding it hard to eat last week- no appetite.   Advised patient I would send notes to PCP- patient does not have follow up appointment scheduled and states she has not seen PCP recently- advised she may need an appointment- message sent for review and possible scheduling .

## 2020-07-22 NOTE — Telephone Encounter (Signed)
  Notes to clinic:  REQUEST FOR 90 DAYS PRESCRIPTION. DX Code Needed.   Requested Prescriptions  Pending Prescriptions Disp Refills   buPROPion (WELLBUTRIN XL) 150 MG 24 hr tablet [Pharmacy Med Name: BUPROPION HCL XL 150 MG TABLET] 90 tablet 1    Sig: TAKE 1 TABLET BY MOUTH EVERY DAY      Psychiatry: Antidepressants - bupropion Passed - 07/22/2020 11:35 AM      Passed - Last BP in normal range    BP Readings from Last 1 Encounters:  05/29/20 107/62          Passed - Valid encounter within last 6 months    Recent Outpatient Visits           1 month ago GAD (generalized anxiety disorder)   [facility] [facility], Jodell Cipro, PA-C   2 months ago PUD (peptic ulcer disease)   [facility] Maple Hudson., MD   6 months ago Gastroesophageal reflux disease, unspecified whether esophagitis present   [facility] Chrismon, Jodell Cipro, PA-C   6 months ago COVID-19 virus infection   [employer], Jodell Cipro, PA-C   7 months ago GAD (generalized anxiety disorder)   [employer], [place], [place]

## 2020-07-22 NOTE — Telephone Encounter (Signed)
FYI see nurse message below in regards to patient. KW

## 2020-07-22 NOTE — Telephone Encounter (Signed)
Yes. Would advise f/u appt

## 2020-07-23 ENCOUNTER — Ambulatory Visit: Payer: Self-pay | Admitting: *Deleted

## 2020-07-23 NOTE — Telephone Encounter (Addendum)
Kimberly Cameron calls with increased anxiety over the last few weeks. Her symptoms include jitteriness, HA, nausea and difficulty concentrating especially with her college work. Recently changed jobs and at this time has a lot of free time before starting the new job. Taking Lexapro 10 mg tabs and Wellbutrin XL 150 mg tabs daily she reports. No longer using hydroxyzine. Denies any harmful thoughts, she feels though the medications aren't helping at this time. Reviewed care advice including: Look for calming apps to use Try a yoga class Increase daily water intake Decrease daily caffeine/alcohol consumption Deep breathing exercises Out of the house activities to do during the day when she is alone Therapist referral-she was seeing one until August last year. Appointment made with pcp for tomorrow.   Closing encounter   Reason for Disposition  [1] Started on anti-anxiety medication AND [2] no relief  Symptoms interfere with work or school  Answer Assessment - Initial Assessment Questions 1. CONCERN: "Did anything happen that prompted you to call today?"      Past 2 weeks she changed jobs 2. ANXIETY SYMPTOMS: "Can you describe how you (your loved one; patient) have been feeling?" (e.g., tense, restless, panicky, anxious, keyed up, overwhelmed, sense of impending doom).      Hands fidgety, fast heartbeat 3. ONSET: "How long have you been feeling this way?" (e.g., hours, days, weeks)     Beginning of summer 4. SEVERITY: "How would you rate the level of anxiety?" (e.g., 0 - 10; or mild, moderate, severe).     7 5. FUNCTIONAL IMPAIRMENT: "How have these feelings affected your ability to do daily activities?" "Have you had more difficulty than usual doing your normal daily activities?" (e.g., getting better, same, worse; self-care, school, work, interactions)     Hard to focus to do college work 6. HISTORY: "Have you felt this way before?" "Have you ever been diagnosed with an anxiety problem in the  past?" (e.g., generalized anxiety disorder, panic attacks, PTSD). If Yes, ask: "How was this problem treated?" (e.g., medicines, counseling, etc.)     Yes, been diagnosed previously 7. RISK OF HARM - SUICIDAL IDEATION: "Do you ever have thoughts of hurting or killing yourself?" If Yes, ask:  "Do you have these feelings now?" "Do you have a plan on how you would do this?"     no 8. TREATMENT:  "What has been done so far to treat this anxiety?" (e.g., medicines, relaxation strategies). "What has helped?"    medications 9. TREATMENT - THERAPIST: "Do you have a counselor or therapist? Name?"     No. She stopped seeing a therapist last August. 10. POTENTIAL TRIGGERS: "Do you drink caffeinated beverages (e.g., coffee, colas, teas), and how much daily?" "Do you drink alcohol or use any drugs?" "Have you started any new medicines recently?"     One coffee, alcohol over the weekend 10. PATIENT SUPPORT: "Who is with you now?" "Who do you live with?" "Do you have family or friends who you can talk to?"        Lives with BF. 11. OTHER SYMPTOMS: "Do you have any other symptoms?" (e.g., feeling depressed, trouble concentrating, trouble sleeping, trouble breathing, palpitations or fast heartbeat, chest pain, sweating, nausea, or diarrhea)       Jittery, was having palpitations, not at this time. Trouble focusing 12. PREGNANCY: "Is there any chance you are pregnant?" "When was your last menstrual period?"       No  Protocols used: Anxiety and Panic Attack-A-AH

## 2020-07-24 ENCOUNTER — Encounter: Payer: Self-pay | Admitting: Family Medicine

## 2020-07-24 ENCOUNTER — Other Ambulatory Visit: Payer: Self-pay

## 2020-07-24 ENCOUNTER — Ambulatory Visit (INDEPENDENT_AMBULATORY_CARE_PROVIDER_SITE_OTHER): Payer: 59 | Admitting: Family Medicine

## 2020-07-24 VITALS — BP 116/83 | HR 97 | Temp 98.1°F | Resp 16 | Wt 184.0 lb

## 2020-07-24 DIAGNOSIS — K279 Peptic ulcer, site unspecified, unspecified as acute or chronic, without hemorrhage or perforation: Secondary | ICD-10-CM | POA: Diagnosis not present

## 2020-07-24 DIAGNOSIS — K219 Gastro-esophageal reflux disease without esophagitis: Secondary | ICD-10-CM

## 2020-07-24 DIAGNOSIS — F411 Generalized anxiety disorder: Secondary | ICD-10-CM | POA: Diagnosis not present

## 2020-07-24 MED ORDER — SERTRALINE HCL 50 MG PO TABS: 50.0000 mg | ORAL_TABLET | Freq: Every day | ORAL | 3 refills | Status: DC

## 2020-07-24 MED ORDER — SUCRALFATE 1 GM/10ML PO SUSP: 1.0000 g | Freq: Two times a day (BID) | ORAL | 0 refills | Status: DC | PRN

## 2020-07-24 NOTE — Progress Notes (Signed)
Established patient visit   Patient: Kimberly Cameron   DOB: 03-11-1995   25 y.o. Female  MRN: [id number] Visit Date: 07/24/2020  Today's healthcare provider: Shirlee Latch, MD   Chief Complaint  Patient presents with   Anxiety   Follow-up   Subjective    Anxiety Symptoms include nervous/anxious behavior. Patient reports no chest pain, dizziness, nausea, palpitations or shortness of breath.     Anxiety, Follow-up  She was last seen for anxiety 2 months ago. Changes made at last visit include continue Wellbutrin-XL 150mg  qd and taper off the Lexapro. Patient states that once she stopped Lexapro she felt her symptoms of anxiety increased and restarted Lexapro on 07/20/20.   She reports lack of motivation and this is affecting her activity level and school work.She feels that she is experiencing signs of depression. She has tried ADHD medication in the past. Further she feels like the welbutrin isn't working. Despite her efforts her symptoms have not improved. She also reports lexapro makes her feel like a "zombie."   She reports good compliance with treatment. She reports good tolerance of treatment. She is not having side effects.   She feels her anxiety is moderate and Worse since last visit. She denies any difficulty sleeping  Symptoms: No chest pain Yes difficulty concentrating  Yes dizziness Yes fatigue  No feelings of losing control No insomnia  No irritable No palpitations  Yes panic attacks Yes racing thoughts  Yes shortness of breath Yes sweating  Yes tremors/shakes    GAD-7 Results GAD-7 Generalized Anxiety Disorder Screening Tool 04/25/2020 12/19/2019  1. Feeling Nervous, Anxious, or on Edge 1 3  2. Not Being Able to Stop or Control Worrying 1 3  3. Worrying Too Much About Different Things 1 1  4. Trouble Relaxing 2 1  5. Being So Restless it's Hard To Sit Still 0 0  6. Becoming Easily Annoyed or Irritable 1 0  7. Feeling Afraid As If Something Awful  Might Happen 0 0  Total GAD-7 Score 6 8  Difficulty At Work, Home, or Getting  Along With Others? Very difficult Not difficult at all    PHQ-9 Scores PHQ9 SCORE ONLY 07/24/2020 04/25/2020 12/19/2019  PHQ-9 Total Score 10 11 2     ---------------------------------------------------------------------------------------------------  Follow up for PUD  The patient was last seen for this 2 months ago. Changes made at last visit include none.  She reports fair compliance with treatment. Patient states that she is not taking sulcrafate and states that after last office visit she discontinued Protonix. Patient states that since her anxiety has been up so has her symptoms of acid reflux and has since restarted back on Protonix on 07/20/20.   She feels that condition is Worse. She has been experiencing a burning sensation. She has seen GI and she was recommended to being taking the sucralfate. In the past week she had a loss in appetite. She denies blood in stool, major changes in bowel movements and abdominal pain.   She is not having side effects.   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 No pertinent past medical history. No Known Allergies     Medications: Outpatient Medications Prior to Visit  Medication Sig   buPROPion (  WELLBUTRIN XL) 150 MG 24 hr tablet TAKE 1 TABLET BY MOUTH EVERY DAY   hydrOXYzine (ATARAX/VISTARIL) 10 MG tablet Take 1 tablet (10 mg total) by mouth 3 (three) times daily as needed for anxiety.   pantoprazole (PROTONIX) 40 MG tablet TAKE 1 TABLET BY MOUTH EVERY DAY   [DISCONTINUED] escitalopram (LEXAPRO) 10 MG  tablet Take 1 tablet (10 mg total) by mouth at bedtime.   [DISCONTINUED] sucralfate (CARAFATE) 1 GM/10ML suspension Take 10 mLs (1 g total) by mouth 3 times/day as needed-between meals & bedtime.   [DISCONTINUED] amoxicillin-clavulanate (AUGMENTIN) 250-62.5 MG/5ML suspension SMARTSIG:10 Milliliter(s) By Mouth Every 8 Hours   [DISCONTINUED] benzonatate (TESSALON) 100 MG capsule Take 100 mg by mouth 3 (three) times daily as needed.   [DISCONTINUED] brompheniramine-pseudoephedrine-DM 30-2-10 MG/5ML syrup Take 10 mLs by mouth every 4 (four) hours as needed.   [DISCONTINUED] predniSONE (DELTASONE) 20 MG tablet Take by mouth.   No facility-administered medications prior to visit.   Review of Systems  Constitutional:  Negative for chills, fatigue and fever.  HENT:  Negative for ear pain, sinus pressure, sinus pain and sore throat.   Eyes:  Negative for pain and visual disturbance.  Respiratory:  Negative for cough, chest tightness, shortness of breath and wheezing.   Cardiovascular:  Negative for chest pain, palpitations and leg swelling.  Gastrointestinal:  Negative for abdominal pain, blood in stool, diarrhea, nausea and vomiting.  Genitourinary:  Negative for flank pain, frequency, pelvic pain and urgency.  Musculoskeletal:  Negative for back pain, myalgias and neck pain.  Neurological:  Negative for dizziness, weakness, light-headedness, numbness and headaches.  Psychiatric/Behavioral:  Negative for sleep disturbance. The patient is nervous/anxious.       Objective    BP 116/83   Pulse 97   Temp 98.1 F (36.7 C) (Oral)   Resp 16   Wt 184 lb (83.5 kg)   LMP  (Within Weeks)   SpO2 99%   BMI 27.98 kg/m     Physical Exam Vitals reviewed.  Constitutional:      General: She is not in acute distress.    Appearance: Normal appearance. She is well-developed. She is not diaphoretic.  HENT:     Head: Normocephalic and atraumatic.  Eyes:     General: No scleral icterus.     Conjunctiva/sclera: Conjunctivae normal.  Neck:     Thyroid: No thyromegaly.  Cardiovascular:     Rate and Rhythm: Normal rate and regular rhythm.     Pulses: Normal pulses.     Heart sounds: Normal heart sounds. No murmur heard. Pulmonary:     Effort: Pulmonary effort is normal. No respiratory distress.     Breath sounds: Normal breath sounds. No wheezing, rhonchi or rales.  Musculoskeletal:     Cervical back: Neck supple.     Right lower leg: No edema.     Left lower leg: No edema.  Lymphadenopathy:     Cervical: No cervical adenopathy.  Skin:    General: Skin is warm and dry.     Findings: No rash.  Neurological:     Mental Status: She is alert and oriented to person, place, and time. Mental status is at baseline.  Psychiatric:        Mood and Affect: Mood is anxious and depressed.        Behavior: Behavior normal.    No results found for any visits on 07/24/20.  Assessment & Plan     Problem List Items Addressed This Visit  Digestive   Gastroesophageal reflux disease    Chronic and uncontrolled Previous PCP concerned for possible PUD, my concern is more gastritis Continue PPI and resume carafate Ref to GI       Relevant Medications   sucralfate (CARAFATE) 1 GM/10ML suspension   Other Relevant Orders   Ambulatory referral to Gastroenterology     Other   GAD (generalized anxiety disorder) - Primary    chronic and uncontrolled Continue wellbutrin XL at current dose - consider discontinuing in the future Change lexapro to zoloft Will Start 50 mg daily Discussed potential side effects, incl GI upset, sexual dysfunction, increased anxiety, and SI Discussed that it can take 6-8 weeks to reach full efficacy Contracted for safety - no SI/HI Discussed synergistic effects of medications and therapy        Relevant Medications   sertraline (ZOLOFT) 50 MG tablet   Other Visit Diagnoses     PUD (peptic ulcer disease)       Relevant Medications   sucralfate  (CARAFATE) 1 GM/10ML suspension        Return in about 3 months (around 10/24/2020) for MDD/GAD f/u, With new PCP.      I,Essence Turner,acting as a Neurosurgeon for Shirlee Latch, MD.,have documented all relevant documentation on the behalf of Shirlee Latch, MD,as directed by  Shirlee Latch, MD while in the presence of Shirlee Latch, MD.  I, Shirlee Latch, MD, have reviewed all documentation for this visit. The documentation on 07/24/20 for the exam, diagnosis, procedures, and orders are all accurate and complete.   Eural Holzschuh, Marzella Schlein, MD, MPH [facility]

## 2020-07-24 NOTE — Assessment & Plan Note (Signed)
Chronic and uncontrolled Previous PCP concerned for possible PUD, my concern is more gastritis Continue PPI and resume carafate Ref to GI

## 2020-07-24 NOTE — Assessment & Plan Note (Signed)
chronic and uncontrolled Continue wellbutrin XL at current dose - consider discontinuing in the future Change lexapro to zoloft Will Start 50 mg daily Discussed potential side effects, incl GI upset, sexual dysfunction, increased anxiety, and SI Discussed that it can take 6-8 weeks to reach full efficacy Contracted for safety - no SI/HI Discussed synergistic effects of medications and therapy

## 2020-07-28 ENCOUNTER — Encounter: Payer: Self-pay | Admitting: *Deleted

## 2020-08-15 ENCOUNTER — Other Ambulatory Visit: Payer: Self-pay | Admitting: Family Medicine

## 2020-08-15 NOTE — Telephone Encounter (Signed)
   Notes to clinic:  REQUEST FOR 90 DAYS PRESCRIPTION  Requested Prescriptions  Pending Prescriptions Disp Refills   sertraline (ZOLOFT) 50 MG tablet [Pharmacy Med Name: SERTRALINE HCL 50 MG TABLET] 90 tablet 2    Sig: TAKE 1 TABLET BY MOUTH EVERY DAY     Psychiatry:  Antidepressants - SSRI Passed - 08/15/2020  2:32 PM      Passed - Valid encounter within last 6 months    Recent Outpatient Visits           3 weeks ago GAD (generalized anxiety disorder)   [facility] [place], Marzella Schlein, MD   2 months ago GAD (generalized anxiety disorder)   [facility] Chrismon, Jodell Cipro, PA-C   3 months ago PUD (peptic ulcer disease)   [facility] Maple Hudson., MD   7 months ago Gastroesophageal reflux disease, unspecified whether esophagitis present   [facility] Chrismon, Jodell Cipro, PA-C   7 months ago COVID-19 virus infection   [facility], Jodell Cipro, PA-C       Future Appointments             In 2 months Suzie Portela, Daryl Eastern, FNP [facility]

## 2020-09-17 ENCOUNTER — Ambulatory Visit (INDEPENDENT_AMBULATORY_CARE_PROVIDER_SITE_OTHER): Payer: 59 | Admitting: Gastroenterology

## 2020-09-17 ENCOUNTER — Encounter: Payer: Self-pay | Admitting: Gastroenterology

## 2020-09-17 VITALS — BP 107/69 | HR 99 | Temp 98.6°F | Ht 68.0 in | Wt 183.6 lb

## 2020-09-17 DIAGNOSIS — K2100 Gastro-esophageal reflux disease with esophagitis, without bleeding: Secondary | ICD-10-CM | POA: Diagnosis not present

## 2020-09-17 NOTE — Progress Notes (Signed)
Gastroenterology Consultation  Referring Provider:     Erasmo Downer, MD Primary Care Physician:  Erasmo Downer, MD Primary Gastroenterologist:  Dr. Servando Snare     Reason for Consultation:     GERD        HPI:   Kimberly Cameron is a 25 y.o. y/o female referred for consultation & management of GERD by Dr. Beryle Flock, Marzella Schlein, MD. This patient comes in after being seen by her primary care provider and was reporting that she had stopped her anxiety medications but restarted them in July when her symptoms got worse.  The patient at that time also sore that her reflux had gotten worse and had previously stopped her Protonix but restarted the Protonix when her symptoms returned.  She had reported to her primary care provider that she felt that her symptoms were not getting better and therefore was sent to me for evaluation.  There is no report of any black stools or bloody stools. The patient reports a which she takes Protonix the reflux symptoms returned with a sense of burning 4 hours later.  She also reports that she has significant burning when she wakes up in the morning.  She is presently not taking any of the PPI medication because she states that she feels like it is only a Band-Aid.  She states that it is not a complete fix.  The patient also ports that she does not want to undergo any surgical treatment for this.  She states that she knowledge is that she has a irrational fear associated with the burning and that causes her more anxiety which then causes her more burning.  There is no report of any unexplained weight loss fevers chills nausea or vomiting and she states that she has been eating healthy without any improvement in her symptoms.  Past Medical History:  Diagnosis Date   Anxiety    GERD (gastroesophageal reflux disease)     Past Surgical History:  Procedure Laterality Date   TONSILLECTOMY     in 7th grade    Prior to Admission medications   Medication Sig Start  Date End Date Taking? Authorizing Provider  buPROPion (WELLBUTRIN XL) 150 MG 24 hr tablet TAKE 1 TABLET BY MOUTH EVERY DAY 07/22/20   Bacigalupo, Marzella Schlein, MD  hydrOXYzine (ATARAX/VISTARIL) 10 MG tablet Take 1 tablet (10 mg total) by mouth 3 (three) times daily as needed for anxiety. 11/21/19   Margaretann Loveless, PA-C  pantoprazole (PROTONIX) 40 MG tablet TAKE 1 TABLET BY MOUTH EVERY DAY 07/17/20   Chrismon, Jodell Cipro, PA-C  sertraline (ZOLOFT) 50 MG tablet TAKE 1 TABLET BY MOUTH EVERY DAY 08/18/20   Bacigalupo, Marzella Schlein, MD  sucralfate (CARAFATE) 1 GM/10ML suspension Take 10 mLs (1 g total) by mouth 3 times/day as needed-between meals & bedtime. 07/24/20   Erasmo Downer, MD    Family History  Problem Relation Age of Onset   Diabetes Father    Clotting disorder Brother      Social History   Tobacco Use   Smoking status: Never   Smokeless tobacco: Never  Substance Use Topics   Alcohol use: Yes   Drug use: No    Allergies as of 09/17/2020   (No Known Allergies)    Review of Systems:    All systems reviewed and negative except where noted in HPI.   Physical Exam:  BP 107/69 (BP Location: Left Arm, Patient Position: Sitting, Cuff Size: Large)   Pulse  99   Temp 98.6 F (37 C) (Temporal)   Ht 5\' 8"  (1.727 m)   Wt 183 lb 9.6 oz (83.3 kg)   BMI 27.92 kg/m  No LMP recorded. General:   Alert,  Well-developed, well-nourished, pleasant and cooperative in NAD Head:  Normocephalic and atraumatic. Eyes:  Sclera clear, no icterus.   Conjunctiva pink. Ears:  Normal auditory acuity. Neck:  Supple; no masses or thyromegaly. Lungs:  Respirations even and unlabored.  Clear throughout to auscultation.   No wheezes, crackles, or rhonchi. No acute distress. Heart:  Regular rate and rhythm; no murmurs, clicks, rubs, or gallops. Abdomen:  Normal bowel sounds.  No bruits.  Soft, non-tender and non-distended without masses, hepatosplenomegaly or hernias noted.  No guarding or rebound  tenderness.  Negative Carnett sign.   Rectal:  Deferred.  Pulses:  Normal pulses noted. Extremities:  No clubbing or edema.  No cyanosis. Neurologic:  Alert and oriented x3;  grossly normal neurologically. Skin:  Intact without significant lesions or rashes.  No jaundice. Lymph Nodes:  No significant cervical adenopathy. Psych:  Alert and cooperative. Normal mood and affect.  Imaging Studies: No results found.  Assessment and Plan:   Kimberly Cameron is a 25 y.o. y/o female who comes in today with a history of what she describes as burning from reflux.  She states that she takes a PPI and it wears off after 4 hours and she does not like being on medication therefore she does not take the PPI. She also reports that she would rather not undergo any surgical correction of the lower esophageal sphincter for reflux if that is determined to be the cause of her symptoms.  The patient has been recommended to start the PPI for 2 weeks prior to having a pH study to see if she is actually having acid breakthrough with acid in the esophagus while on the medication indicating that the medication is not working.  The patient states that she is willing to undergo that procedure.  The patient will be set up for an esophageal pH study.  The patient has been explained the plan and agrees with it.    30, MD. Midge Minium    Note: This dictation was prepared with Dragon dictation along with smaller phrase technology. Any transcriptional errors that result from this process are unintentional.

## 2020-10-09 ENCOUNTER — Telehealth: Payer: Self-pay

## 2020-10-09 NOTE — Telephone Encounter (Signed)
Copied from CRM [id number]. Topic: General - Inquiry >> Oct 09, 2020  9:19 AM Daphine Deutscher D wrote: Reason for CRM:  Pt stated she seen Dr. Sullivan Lone and spoke with him about seeing someone for anxiety and to call back if she decided to.  She would like to be set up with one.  CB#  [phone]

## 2020-10-20 ENCOUNTER — Telehealth: Payer: Self-pay | Admitting: Family Medicine

## 2020-10-20 DIAGNOSIS — K219 Gastro-esophageal reflux disease without esophagitis: Secondary | ICD-10-CM

## 2020-10-20 DIAGNOSIS — K279 Peptic ulcer, site unspecified, unspecified as acute or chronic, without hemorrhage or perforation: Secondary | ICD-10-CM

## 2020-10-20 MED ORDER — PANTOPRAZOLE SODIUM 40 MG PO TBEC: 40.0000 mg | DELAYED_RELEASE_TABLET | Freq: Every day | ORAL | 3 refills | Status: DC

## 2020-10-20 NOTE — Telephone Encounter (Signed)
[facility] faxed refill request for the following medications:  pantoprazole (PROTONIX) 40 MG tablet  Last Rx: 07/17/20 LOV: 07/24/20 with Dr. Leonard Schwartz NOV: 10/24/20 with Robynn Pane Please advise. Thanks TNP

## 2020-10-24 ENCOUNTER — Other Ambulatory Visit: Payer: Self-pay

## 2020-10-24 ENCOUNTER — Ambulatory Visit (INDEPENDENT_AMBULATORY_CARE_PROVIDER_SITE_OTHER): Payer: 59 | Admitting: Family Medicine

## 2020-10-24 ENCOUNTER — Encounter: Payer: Self-pay | Admitting: Family Medicine

## 2020-10-24 VITALS — BP 106/67 | HR 69 | Temp 97.7°F | Resp 16 | Wt 186.6 lb

## 2020-10-24 DIAGNOSIS — F988 Other specified behavioral and emotional disorders with onset usually occurring in childhood and adolescence: Secondary | ICD-10-CM

## 2020-10-24 DIAGNOSIS — K279 Peptic ulcer, site unspecified, unspecified as acute or chronic, without hemorrhage or perforation: Secondary | ICD-10-CM | POA: Diagnosis not present

## 2020-10-24 DIAGNOSIS — K219 Gastro-esophageal reflux disease without esophagitis: Secondary | ICD-10-CM | POA: Diagnosis not present

## 2020-10-24 DIAGNOSIS — F411 Generalized anxiety disorder: Secondary | ICD-10-CM | POA: Diagnosis not present

## 2020-10-24 DIAGNOSIS — F909 Attention-deficit hyperactivity disorder, unspecified type: Secondary | ICD-10-CM | POA: Insufficient documentation

## 2020-10-24 MED ORDER — SUCRALFATE 1 GM/10ML PO SUSP: 1.0000 g | Freq: Two times a day (BID) | ORAL | 3 refills | Status: DC | PRN

## 2020-10-24 MED ORDER — DEXMETHYLPHENIDATE HCL 5 MG PO TABS: 5.0000 mg | ORAL_TABLET | Freq: Two times a day (BID) | ORAL | 0 refills | Status: DC

## 2020-10-24 NOTE — Assessment & Plan Note (Signed)
Chronic, improving Still as little interest in getting school work done [employer] that she procrastinates and chooses her priorities incorrectly Trial of ADD medication as on as a child; and was taking an expired medication PRN and noticed an improvement

## 2020-10-24 NOTE — Progress Notes (Signed)
Established patient visit   Patient: Kimberly Cameron   DOB: 1995/06/03   25 y.o. Female  MRN: [id number] Visit Date: 10/24/2020  Today's healthcare provider: [name], FNP   Chief Complaint  Patient presents with   Anxiety   Gastroesophageal Reflux   Subjective    HPI  Anxiety, Follow-up  She was last seen for anxiety 3 months ago. Changes made at last visit include continue Wellbutrin and changed Lexapro to Zoloft 50mg .   She reports fair compliance with treatment. She reports good tolerance of treatment. She is not having side effects.   She feels her anxiety is mild and Improved since last visit.  Symptoms: No chest pain Yes difficulty concentrating  No dizziness No fatigue  No feelings of losing control No insomnia  No irritable No palpitations  No panic attacks No racing thoughts  No shortness of breath No sweating  No tremors/shakes    GAD-7 Results GAD-7 Generalized Anxiety Disorder Screening Tool 04/25/2020 12/19/2019  1. Feeling Nervous, Anxious, or on Edge 1 3  2. Not Being Able to Stop or Control Worrying 1 3  3. Worrying Too Much About Different Things 1 1  4. Trouble Relaxing 2 1  5. Being So Restless it's Hard To Sit Still 0 0  6. Becoming Easily Annoyed or Irritable 1 0  7. Feeling Afraid As If Something Awful Might Happen 0 0  Total GAD-7 Score 6 8  Difficulty At Work, Home, or Getting  Along With Others? Very difficult Not difficult at all    PHQ-9 Scores PHQ9 SCORE ONLY 10/24/2020 07/24/2020 04/25/2020  PHQ-9 Total Score 4 10 11     ---------------------------------------------------------------------------------------------------  GERD, Follow up:  The patient was last seen for GERD 3 months ago. Changes made since that visit include continue PPI and resume Carafate .  She reports fair compliance with treatment.Patient did not resume Carafate She is not having side effects. .  She IS experiencing early satiety, fullness after  meals, heartburn, hoarseness, and nocturnal burning. She is NOT experiencing abdominal bloating, belching, chest pain, or choking on food  -----------------------------------------------------------------------------------------     Medications: Outpatient Medications Prior to Visit  Medication Sig   buPROPion (WELLBUTRIN XL) 150 MG 24 hr tablet TAKE 1 TABLET BY MOUTH EVERY DAY   hydrOXYzine (ATARAX/VISTARIL) 10 MG tablet Take 1 tablet (10 mg total) by mouth 3 (three) times daily as needed for anxiety.   pantoprazole (PROTONIX) 40 MG tablet Take 1 tablet (40 mg total) by mouth daily.   sertraline (ZOLOFT) 50 MG tablet TAKE 1 TABLET BY MOUTH EVERY DAY   [DISCONTINUED] sucralfate (CARAFATE) 1 GM/10ML suspension Take 10 mLs (1 g total) by mouth 3 times/day as needed-between meals & bedtime. (Patient not taking: Reported on 10/24/2020)   No facility-administered medications prior to visit.    Review of Systems     Objective    BP 106/67   Pulse 69   Temp 97.7 F (36.5 C) (Temporal)   Resp 16   Wt 186 lb 9.6 oz (84.6 kg)   BMI 28.37 kg/m    Physical Exam Vitals and nursing note reviewed.  Constitutional:      General: She is not in acute distress.    Appearance: Normal appearance. She is overweight. She is not ill-appearing, toxic-appearing or diaphoretic.  HENT:     Head: Normocephalic and atraumatic.  Cardiovascular:     Rate and Rhythm: Normal rate and regular rhythm.     Pulses:  Normal pulses.     Heart sounds: Normal heart sounds. No murmur heard.   No friction rub. No gallop.  Pulmonary:     Effort: Pulmonary effort is normal. No respiratory distress.     Breath sounds: Normal breath sounds. No stridor. No wheezing, rhonchi or rales.  Chest:     Chest wall: No tenderness.  Abdominal:     General: Bowel sounds are normal.     Palpations: Abdomen is soft.  Musculoskeletal:        General: No swelling, tenderness, deformity or signs of injury. Normal range of  motion.     Right lower leg: No edema.     Left lower leg: No edema.  Skin:    General: Skin is warm and dry.     Capillary Refill: Capillary refill takes less than 2 seconds.     Coloration: Skin is not jaundiced or pale.     Findings: No bruising, erythema, lesion or rash.  Neurological:     General: No focal deficit present.     Mental Status: She is alert and oriented to person, place, and time. Mental status is at baseline.     Cranial Nerves: No cranial nerve deficit.     Sensory: No sensory deficit.     Motor: No weakness.     Coordination: Coordination normal.  Psychiatric:        Mood and Affect: Mood normal. Affect is flat.        Behavior: Behavior normal.        Thought Content: Thought content normal.        Cognition and Memory: Cognition and memory normal.        Judgment: Judgment normal.     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  Assessment & Plan     Problem List Items Addressed This Visit       Digestive   Gastroesophageal reflux disease without esophagitis - Primary    Stable on PPI; continue Modify diet as needed      Relevant Medications   sucralfate (CARAFATE) 1 GM/10ML suspension   PUD (peptic ulcer disease)    Encouraged to use carafate; has not been using, refill sent to ensure supply      Relevant Medications   sucralfate (CARAFATE) 1 GM/10ML suspension     Other   GAD (generalized anxiety disorder)    Chronic, improving Still as little interest in getting school work done [employer] that she procrastinates and chooses her priorities incorrectly Trial of ADD medication as on as a child; and was taking an expired medication PRN  and noticed an improvement       Relevant Orders   Ambulatory referral to Psychiatry   Attention deficit disorder (ADD) without hyperactivity    Trial of medication- use as needed Recommend holidays when not needed including weekends, days not working, etc      Relevant Medications   dexmethylphenidate (FOCALIN) 5 MG tablet    Return in about 8 weeks (around 12/19/2020) for anxiety and depression.      Leilani Merl, FNP, have reviewed all documentation for this visit. The documentation on  10/24/20 for the exam, diagnosis, procedures, and orders are all accurate and complete.    [name], FNP  [facility] [phone] (phone) [phone] (fax)  [facility]

## 2020-10-24 NOTE — Assessment & Plan Note (Signed)
Trial of medication- use as needed Recommend holidays when not needed including weekends, days not working, etc

## 2020-10-24 NOTE — Assessment & Plan Note (Signed)
Encouraged to use carafate; has not been using, refill sent to ensure supply

## 2020-10-24 NOTE — Assessment & Plan Note (Signed)
Stable on PPI; continue Modify diet as needed

## 2020-12-01 ENCOUNTER — Other Ambulatory Visit: Payer: Self-pay | Admitting: Family Medicine

## 2020-12-17 ENCOUNTER — Ambulatory Visit: Payer: Self-pay

## 2020-12-17 NOTE — Telephone Encounter (Signed)
Patient called in with sore throat, wants to know what she can take for it   Pt. Reports her mother is positive for COVID 19 and today she started having symptoms. Sore throat, congestion. No availability in the practice. Pt. States she may do My Chart e-visit.   Answer Assessment - Initial Assessment Questions 1. ONSET: "When did the throat start hurting?" (Hours or days ago)      Today 2. SEVERITY: "How bad is the sore throat?" (Scale 1-10; mild, moderate or severe)   - MILD (1-3):  doesn't interfere with eating or normal activities   - MODERATE (4-7): interferes with eating some solids and normal activities   - SEVERE (8-10):  excruciating pain, interferes with most normal activities   - SEVERE DYSPHAGIA: can't swallow liquids, drooling     Mild 3. STREP EXPOSURE: "Has there been any exposure to strep within the past week?" If Yes, ask: "What type of contact occurred?"      No 4.  VIRAL SYMPTOMS: "Are there any symptoms of a cold, such as a runny nose, cough, hoarse voice or red eyes?"      Runny nose 5. FEVER: "Do you have a fever?" If Yes, ask: "What is your temperature, how was it measured, and when did it start?"     No 6. PUS ON THE TONSILS: "Is there pus on the tonsils in the back of your throat?"     No 7. OTHER SYMPTOMS: "Do you have any other symptoms?" (e.g., difficulty breathing, headache, rash)     No 8. PREGNANCY: "Is there any chance you are pregnant?" "When was your last menstrual period?"     No  Protocols used: Sore Throat-A-AH

## 2021-01-06 ENCOUNTER — Other Ambulatory Visit: Payer: Self-pay | Admitting: Family Medicine

## 2021-01-06 DIAGNOSIS — F988 Other specified behavioral and emotional disorders with onset usually occurring in childhood and adolescence: Secondary | ICD-10-CM

## 2021-01-06 MED ORDER — DEXMETHYLPHENIDATE HCL 5 MG PO TABS: 5.0000 mg | ORAL_TABLET | Freq: Two times a day (BID) | ORAL | 0 refills | Status: DC

## 2021-02-02 ENCOUNTER — Other Ambulatory Visit: Payer: Self-pay | Admitting: Family Medicine

## 2021-02-02 MED ORDER — SERTRALINE HCL 50 MG PO TABS: 50.0000 mg | ORAL_TABLET | Freq: Every day | ORAL | 0 refills | Status: DC

## 2021-04-13 ENCOUNTER — Telehealth (INDEPENDENT_AMBULATORY_CARE_PROVIDER_SITE_OTHER): Payer: 59 | Admitting: Family Medicine

## 2021-04-13 ENCOUNTER — Encounter: Payer: Self-pay | Admitting: Family Medicine

## 2021-04-13 VITALS — Wt 185.0 lb

## 2021-04-13 DIAGNOSIS — Z3009 Encounter for other general counseling and advice on contraception: Secondary | ICD-10-CM

## 2021-04-13 NOTE — Progress Notes (Signed)
? ? ?I,Sulibeya S Dimas,acting as a scribe for Lavon Paganini, MD.,have documented all relevant documentation on the behalf of Lavon Paganini, MD,as directed by  Lavon Paganini, MD while in the presence of Lavon Paganini, MD. ? ?MyChart Video Visit ? ? ? ?Virtual Visit via Video Note  ? ?This visit type was conducted due to national recommendations for restrictions regarding the COVID-19 Pandemic (e.g. social distancing) in an effort to limit this patient's exposure and mitigate transmission in our community. This patient is at least at moderate risk for complications without adequate follow up. This format is felt to be most appropriate for this patient at this time. Physical exam was limited by quality of the video and audio technology used for the visit.  ? ? ?Patient location: home ?Provider location: [facility] ?Persons involved in the visit: patient, provider  ? ?I discussed the limitations of evaluation and management by telemedicine and the availability of in person appointments. The patient expressed understanding and agreed to proceed. ? ?Patient: Kimberly Cameron   DOB: 03-Jul-1995   26 y.o. Female  MRN: [id number] ?Visit Date: 04/13/2021 ? ?Today's healthcare provider: Lavon Paganini, MD  ? ?Chief Complaint  ?Patient presents with  ? Contraception  ? ?Subjective  ?  ?Contraception Counseling ?Patient presents for contraception counseling. The patient has no complaints today. The patient is sexually active. Pertinent past medical history: none. ? ?Not good at remembering to take pills - doesn't think that this is a good option for her ?The idea of a LARC weirds her out at the idea of having something implanted in her. ? ?Has never done contraception in the past ? ?Sexually active with female partners ?Periods are heavy and +Dysmenorrhea ? ?Medications: ?Outpatient Medications Prior to Visit  ?Medication Sig  ? dexmethylphenidate (FOCALIN) 5 MG tablet Take 1 tablet (5 mg total)  by mouth 2 (two) times daily. (Patient taking differently: Take 5 mg by mouth as needed.)  ? sertraline (ZOLOFT) 50 MG tablet Take 1 tablet (50 mg total) by mouth daily.  ? buPROPion (WELLBUTRIN XL) 150 MG 24 hr tablet TAKE 1 TABLET BY MOUTH EVERY DAY (Patient not taking: Reported on 04/13/2021)  ? hydrOXYzine (ATARAX/VISTARIL) 10 MG tablet Take 1 tablet (10 mg total) by mouth 3 (three) times daily as needed for anxiety. (Patient not taking: Reported on 04/13/2021)  ? pantoprazole (PROTONIX) 40 MG tablet Take 1 tablet (40 mg total) by mouth daily. (Patient not taking: Reported on 04/13/2021)  ? sucralfate (CARAFATE) 1 GM/10ML suspension Take 10 mLs (1 g total) by mouth 3 times/day as needed-between meals & bedtime. (Patient not taking: Reported on 04/13/2021)  ? ?No facility-administered medications prior to visit.  ? ? ?Review of Systems  ?Constitutional:  Negative for appetite change and unexpected weight change.  ?Respiratory:  Negative for chest tightness and shortness of breath.   ?Cardiovascular:  Negative for chest pain and palpitations.  ?Neurological:  Negative for headaches.  ?Psychiatric/Behavioral:  Negative for decreased concentration and sleep disturbance. The patient is not nervous/anxious.   ? ?Last CBC ?Lab Results  ?Component Value Date  ? WBC 8.6 05/29/2020  ? HGB 13.9 05/29/2020  ? HCT 42.5 05/29/2020  ? MCV 90 05/29/2020  ? MCH 29.6 05/29/2020  ? RDW 12.0 05/29/2020  ? PLT 372 05/29/2020  ? ?Last metabolic panel ?Lab Results  ?Component Value Date  ? GLUCOSE 86 05/29/2020  ? NA 141 05/29/2020  ? K 3.7 05/29/2020  ? CL 100 05/29/2020  ? CO2 23  05/29/2020  ? BUN 8 05/29/2020  ? CREATININE 0.82 05/29/2020  ? EGFR 102 05/29/2020  ? CALCIUM 9.4 05/29/2020  ? PROT 6.9 05/29/2020  ? ALBUMIN 4.2 05/29/2020  ? LABGLOB 2.7 05/29/2020  ? AGRATIO 1.6 05/29/2020  ? BILITOT 0.2 05/29/2020  ? ALKPHOS 87 05/29/2020  ? AST 14 05/29/2020  ? ALT 18 05/29/2020  ? ?Last thyroid functions ?Lab Results  ?Component Value  Date  ? TSH 2.430 05/29/2020  ? ?  ? ? Objective  ?  ?Wt 185 lb (83.9 kg)   LMP 04/12/2021 (Exact Date)   BMI 28.13 kg/m?  ? ?BP Readings from Last 3 Encounters:  ?10/24/20 106/67  ?09/17/20 107/69  ?07/24/20 116/83  ? ?Wt Readings from Last 3 Encounters:  ?04/13/21 185 lb (83.9 kg)  ?10/24/20 186 lb 9.6 oz (84.6 kg)  ?09/17/20 183 lb 9.6 oz (83.3 kg)  ? ?  ? ? ?Physical Exam ?Constitutional:   ?   General: She is not in acute distress. ?   Appearance: Normal appearance.  ?HENT:  ?   Head: Normocephalic.  ?Pulmonary:  ?   Effort: Pulmonary effort is normal. No respiratory distress.  ?Neurological:  ?   Mental Status: She is alert and oriented to person, place, and time. Mental status is at baseline.  ?  ? ? ? Assessment & Plan  ?  ? ?1. Encounter for counseling regarding contraception ?- discussed contraceptive options with patient - weighing pros and cons, she feels that Depo Provera shot is the right option for her ?- discussed that this does not protect against STDs and recommended condoms ?- discussed need to do a pregnancy test prior to first shot to ensure it is negative ?- discussed using back up contraception x7 days after initial shot ?- She will be set up for nurse visit for Depo shot ? ?No follow-ups on file.  ?  ? ?I discussed the assessment and treatment plan with the patient. The patient was provided an opportunity to ask questions and all were answered. The patient agreed with the plan and demonstrated an understanding of the instructions. ?  ?The patient was advised to call back or seek an in-person evaluation if the symptoms worsen or if the condition fails to improve as anticipated. ? ?I, Lavon Paganini, MD, have reviewed all documentation for this visit. The documentation on 04/13/21 for the exam, diagnosis, procedures, and orders are all accurate and complete. ? ? ?Virginia Crews, MD, MPH ?[place] ?[facility]   ?

## 2021-05-04 ENCOUNTER — Ambulatory Visit (INDEPENDENT_AMBULATORY_CARE_PROVIDER_SITE_OTHER): Payer: 59 | Admitting: Family Medicine

## 2021-05-04 ENCOUNTER — Encounter: Payer: Self-pay | Admitting: Family Medicine

## 2021-05-04 ENCOUNTER — Other Ambulatory Visit (HOSPITAL_COMMUNITY)
Admission: RE | Admit: 2021-05-04 | Discharge: 2021-05-04 | Disposition: A | Discharge status: Home / Self Care | Payer: 59 | Source: Ambulatory Visit | Attending: Family Medicine | Admitting: Family Medicine

## 2021-05-04 VITALS — BP 118/79 | HR 93 | Temp 98.8°F | Resp 16

## 2021-05-04 DIAGNOSIS — Z113 Encounter for screening for infections with a predominantly sexual mode of transmission: Secondary | ICD-10-CM | POA: Diagnosis not present

## 2021-05-04 DIAGNOSIS — Z3042 Encounter for surveillance of injectable contraceptive: Secondary | ICD-10-CM | POA: Diagnosis not present

## 2021-05-04 DIAGNOSIS — B3749 Other urogenital candidiasis: Secondary | ICD-10-CM | POA: Insufficient documentation

## 2021-05-04 DIAGNOSIS — R69 Illness, unspecified: Secondary | ICD-10-CM | POA: Diagnosis not present

## 2021-05-04 LAB — POCT URINE PREGNANCY: Preg Test, Ur: NEGATIVE

## 2021-05-04 MED ORDER — MEDROXYPROGESTERONE ACETATE 150 MG/ML IM SUSP
150.0000 mg | Freq: Once | INTRAMUSCULAR | Status: AC
  Administered 2021-05-04: 150 mg via INTRAMUSCULAR

## 2021-05-04 NOTE — Assessment & Plan Note (Signed)
Complaints of vulvar rash including buttocks present for 3+ weeks with acute worsening ?Hx of sexual encounter 3+ weeks ago, rash developed following sexual encounter ?Condom was worn; and then removed by sexual partner ?Patient does not have contact with sexual partner to discuss symptomology ?Will do urine STI testing today, pt preference, and then treat as needed. ?Advise vaginal exam at later date. ?Continue to recommend condoms for entirety of sexual encounter for best prevention.   ?

## 2021-05-04 NOTE — Progress Notes (Signed)
?  ? ?[employer] as a Neurosurgeon for [name], FNP.,have documented all relevant documentation on the behalf of [name], FNP,as directed by  [name], FNP while in the presence of [name], FNP.  ? ?Established patient visit ? ?Patient: Kimberly Cameron   DOB: September 10, 1995   25 y.o. Female  MRN: [id number] ?Visit Date: 05/04/2021 ? ?Today's healthcare provider: [name], FNP  ?Re Introduced to nurse practitioner role and practice setting.  All questions answered.  Discussed provider/patient relationship and expectations. ? ?Chief Complaint  ?Patient presents with  ? Rash  ? Contraception  ? ?Subjective  ?  ?Rash ?This is a new problem. The current episode started in the past 7 days. The affected locations include the groin. The rash is characterized by itchiness, redness and pain. She was exposed to nothing. Pertinent negatives include no anorexia, congestion, cough, diarrhea, eye pain, facial edema, fatigue, fever, joint pain, nail changes, rhinorrhea, shortness of breath, sore throat or vomiting. Treatments tried: patient states that she did evisit and was prescribed Macrobid. The treatment provided moderate relief.   ? ?Medications: ?Outpatient Medications Prior to Visit  ?Medication Sig  ? sertraline (ZOLOFT) 50 MG tablet Take 1 tablet (50 mg total) by mouth daily.  ? buPROPion (WELLBUTRIN XL) 150 MG 24 hr tablet TAKE 1 TABLET BY MOUTH EVERY DAY (Patient not taking: Reported on 04/13/2021)  ? dexmethylphenidate (FOCALIN) 5 MG tablet Take 1 tablet (5 mg total) by mouth 2 (two) times daily. (Patient not taking: Reported on 05/04/2021)  ? hydrOXYzine (ATARAX/VISTARIL) 10 MG tablet Take 1 tablet (10 mg total) by mouth 3 (three) times daily as needed for anxiety. (Patient not taking: Reported on 04/13/2021)  ? pantoprazole (PROTONIX) 40 MG tablet Take 1 tablet (40 mg total) by mouth daily. (Patient not taking: Reported on 04/13/2021)  ? sucralfate (CARAFATE) 1 GM/10ML suspension Take  10 mLs (1 g total) by mouth 3 times/day as needed-between meals & bedtime. (Patient not taking: Reported on 04/13/2021)  ? ?No facility-administered medications prior to visit.  ? ?Review of Systems  ?Constitutional:  Negative for fatigue and fever.  ?HENT:  Negative for congestion, rhinorrhea and sore throat.   ?Eyes:  Negative for pain.  ?Respiratory:  Negative for cough and shortness of breath.   ?Gastrointestinal:  Negative for anorexia, diarrhea and vomiting.  ?Musculoskeletal:  Negative for joint pain.  ?Skin:  Positive for rash. Negative for nail changes.  ? ?  Objective  ?  ?BP 118/79   Pulse 93   Temp 98.8 ?F (37.1 ?C) (Oral)   Resp 16   LMP 04/12/2021 (Exact Date)   SpO2 92%  ? ?Physical Exam ?Vitals and nursing note reviewed.  ?Constitutional:   ?   General: She is not in acute distress. ?   Appearance: Normal appearance. She is overweight. She is not ill-appearing, toxic-appearing or diaphoretic.  ?HENT:  ?   Head: Normocephalic and atraumatic.  ?Cardiovascular:  ?   Rate and Rhythm: Normal rate and regular rhythm.  ?   Pulses: Normal pulses.  ?   Heart sounds: Normal heart sounds. No murmur heard. ?  No friction rub. No gallop.  ?Pulmonary:  ?   Effort: Pulmonary effort is normal. No respiratory distress.  ?   Breath sounds: Normal breath sounds. No stridor. No wheezing, rhonchi or rales.  ?Chest:  ?   Chest wall: No tenderness.  ?Genitourinary: ?   Comments: Denies vaginal discharge; complaints of generalized  vulvar rash including buttocks, present since sexual encounter >3 weeks ago and getting worse. Was treated for UTI and burning of urination has resolved but rash remains. Prefers Urine testing for STI without vaginal exam today.  ?Musculoskeletal:     ?   General: No swelling, tenderness, deformity or signs of injury. Normal range of motion.  ?   Right lower leg: No edema.  ?   Left lower leg: No edema.  ?Skin: ?   General: Skin is warm and dry.  ?   Capillary Refill: Capillary refill takes  less than 2 seconds.  ?   Coloration: Skin is not jaundiced or pale.  ?   Findings: No bruising, erythema, lesion or rash.  ?Neurological:  ?   General: No focal deficit present.  ?   Mental Status: She is alert and oriented to person, place, and time. Mental status is at baseline.  ?   Cranial Nerves: No cranial nerve deficit.  ?   Sensory: No sensory deficit.  ?   Motor: No weakness.  ?   Coordination: Coordination normal.  ?Psychiatric:     ?   Mood and Affect: Mood normal.     ?   Behavior: Behavior normal.     ?   Thought Content: Thought content normal.     ?   Judgment: Judgment normal.  ?  ? ?No results found for any visits on 05/04/21. ? Assessment & Plan  ?  ? ?Problem List Items Addressed This Visit   ? ?  ? Other  ? Encounter for surveillance of injectable contraceptive  ?  Unprotected intercourse 3+ weeks ago ?Negative urine pregnancy today ?Taking "glo gummies" for PMS symptoms; review of ingredients likely to not interact with medication ?Depo provided today following return of negative urine pregnancy  ? ?  ?  ? Relevant Orders  ? POCT urine pregnancy  ? Screening for STD (sexually transmitted disease) - Primary  ?  Complaints of vulvar rash including buttocks present for 3+ weeks with acute worsening ?Hx of sexual encounter 3+ weeks ago, rash developed following sexual encounter ?Condom was worn; and then removed by sexual partner ?Patient does not have contact with sexual partner to discuss symptomology ?Will do urine STI testing today, pt preference, and then treat as needed. ?Advise vaginal exam at later date. ?Continue to recommend condoms for entirety of sexual encounter for best prevention.   ? ?  ?  ? Relevant Orders  ? Urine cytology ancillary only  ? ?Return in about 3 months (around 08/04/2021), or if symptoms worsen or fail to improve, for nurse follow up, medication administration.  ?   ? ?I, [name], FNP, have reviewed all documentation for this visit. The documentation on 05/04/21  for the exam, diagnosis, procedures, and orders are all accurate and complete. ? ?[name], FNP  ?[facility] ?[phone] (phone) ?[phone] (fax) ? ?[facility] ?

## 2021-05-04 NOTE — Assessment & Plan Note (Signed)
Unprotected intercourse 3+ weeks ago ?Negative urine pregnancy today ?Taking "glo gummies" for PMS symptoms; review of ingredients likely to not interact with medication ?Depo provided today following return of negative urine pregnancy  ?

## 2021-05-06 ENCOUNTER — Other Ambulatory Visit: Payer: Self-pay | Admitting: Family Medicine

## 2021-05-06 ENCOUNTER — Encounter: Payer: Self-pay | Admitting: Family Medicine

## 2021-05-06 DIAGNOSIS — B379 Candidiasis, unspecified: Secondary | ICD-10-CM

## 2021-05-06 LAB — URINE CYTOLOGY ANCILLARY ONLY
Bacterial Vaginitis-Urine: NEGATIVE
Candida Urine: NEGATIVE — AB
Candida Urine: POSITIVE — AB
Chlamydia: NEGATIVE
Comment: NEGATIVE
Comment: NEGATIVE
Comment: NORMAL
Neisseria Gonorrhea: NEGATIVE
Trichomonas: NEGATIVE

## 2021-05-06 MED ORDER — FLUCONAZOLE 150 MG PO TABS
ORAL_TABLET | ORAL | 0 refills | Status: DC
Start: 2021-05-06 — End: 2021-09-22

## 2021-05-06 MED ORDER — NYSTATIN 100000 UNIT/GM EX CREA: 1.0000 "application " | TOPICAL_CREAM | Freq: Two times a day (BID) | CUTANEOUS | 0 refills | Status: DC

## 2021-05-20 NOTE — Progress Notes (Signed)
(  1) ?Gwyneth Sprout, FNP  ?[facility] ?[id number] ?[place], [place] [address] ?[phone] (phone) ?[phone] (fax) ?[facility] ? ?

## 2021-07-20 ENCOUNTER — Other Ambulatory Visit: Payer: Self-pay | Admitting: Family Medicine

## 2021-07-21 MED ORDER — SERTRALINE HCL 50 MG PO TABS: 50.0000 mg | ORAL_TABLET | Freq: Every day | ORAL | 0 refills | Status: DC

## 2021-07-22 ENCOUNTER — Ambulatory Visit: Payer: 59 | Admitting: Family Medicine

## 2021-07-27 ENCOUNTER — Ambulatory Visit (INDEPENDENT_AMBULATORY_CARE_PROVIDER_SITE_OTHER): Payer: 59 | Admitting: Family Medicine

## 2021-07-27 DIAGNOSIS — Z3042 Encounter for surveillance of injectable contraceptive: Secondary | ICD-10-CM | POA: Diagnosis not present

## 2021-07-27 MED ORDER — MEDROXYPROGESTERONE ACETATE 150 MG/ML IM SUSP
150.0000 mg | Freq: Once | INTRAMUSCULAR | Status: AC
  Administered 2021-07-27: 150 mg via INTRAMUSCULAR

## 2021-07-27 NOTE — Progress Notes (Signed)
Nurse Visit:Patient coming in for contraception management. Depo-Provera given today.Patient reports that from her fist depo injection she developed a hard knot with redness size of a quarter. Patient to return between 10/09 - 10/23.

## 2021-09-20 ENCOUNTER — Other Ambulatory Visit: Payer: Self-pay | Admitting: Family Medicine

## 2021-09-20 DIAGNOSIS — B379 Candidiasis, unspecified: Secondary | ICD-10-CM

## 2021-09-22 ENCOUNTER — Ambulatory Visit: Payer: Self-pay

## 2021-09-22 ENCOUNTER — Telehealth: Payer: Self-pay | Admitting: Family Medicine

## 2021-09-22 ENCOUNTER — Telehealth: Payer: 59 | Admitting: Family Medicine

## 2021-09-22 DIAGNOSIS — B379 Candidiasis, unspecified: Secondary | ICD-10-CM

## 2021-09-22 MED ORDER — FLUCONAZOLE 150 MG PO TABS: ORAL_TABLET | ORAL | 0 refills | Status: DC

## 2021-09-22 NOTE — Telephone Encounter (Signed)
Copied from [place] [phone]. Topic: General - Other >> Sep 22, 2021 11:05 AM Kimberly Cameron wrote: Reason for CRM: The patient would like to speak with a member of clinical staff when possible about their previously requested refill of fluconazole (DIFLUCAN) 150 MG tablet [[id number]]   The patient would like to discuss coverage of an office appointment and their insurance  Please contact the patient further when possible

## 2021-09-22 NOTE — Telephone Encounter (Signed)
Chief Complaint: Vaginal symptoms Symptoms: Vaginal itching 1-2/10, vaginal soreness 1-2/10 Frequency: Ongoing for a week or so Pertinent Negatives: Patient denies vaginal discharge Disposition: [] ED /[] Urgent Care (no appt availability in office) / [] Appointment(In office/virtual)/ [x]  [facility]/ [] Home Care/ [] Refused Recommended Disposition /[] [facility]/ []  Follow-up with PCP Additional Notes: Patient requested for an appointment today before 2p or tomorrow, because she has to work at 2p-6p. Advised no available appointments today and tomorrow doesn't fit her time. She asked about going to an UC, advised I can schedule a virtual UC visit for today, she agreed, appt scheduled at 1245.Patient had questions about her insurance paying for the OV (see TE today for the advice given to the patient).    Reason for Disposition  [1] Vaginal itching AND [2] not improved > 3 days following Care Advice  Answer Assessment - Initial Assessment Questions 1. SYMPTOM: "What's the main symptom you're concerned about?" (e.g., pain, itching, dryness)     Soreness, itching 2. LOCATION: "Where is the soreness located?" (e.g., inside/outside, left/right)     Outside  3. ONSET: "When did the symptoms start?"     Within last week 4. PAIN: "Is there any pain?" If Yes, ask: "How bad is it?" (Scale: 1-10; mild, moderate, severe)   -  MILD (1-3): Doesn't interfere with normal activities.    -  MODERATE (4-7): Interferes with normal activities (e.g., work or school) or awakens from sleep.     -  SEVERE (8-10): Excruciating pain, unable to do any normal activities.     1-2 5. ITCHING: "Is there any itching?" If Yes, ask: "How bad is it?" (Scale: 1-10; mild, moderate, severe)     1-2 6. CAUSE: "What do you think is causing the discharge?" "Have you had the same problem before? What happened then?"     Yeast infection had in May, 2023 7. OTHER SYMPTOMS: "Do you have any other symptoms?"  (e.g., fever, itching, vaginal bleeding, pain with urination, injury to genital area, vaginal foreign body)     No other symptoms 8. PREGNANCY: "Is there any chance you are pregnant?" "When was your last menstrual period?"     No, Depo shots  Protocols used: Vaginal Symptoms-A-AH

## 2021-09-22 NOTE — Telephone Encounter (Signed)
See NT encounter for triage vaginal symptoms. Patient also says her last visit and labs were not paid for by her insurance that she had a bill for over $300 and wanted to know if she scheduled again, would we be able to find out from the insurance if the visit and tests would be covered. I advised for her to call her insurance company to discuss the last [place] charges and why it was not covered, why the labs (urine cytology) wasn't covered. Advised if it's due to the coding of the visit to call us back and let us know and maybe the visit coding could be corrected or whatever the insurance advises to correct the visit so that it will be paid for. She verbalized understanding.

## 2021-09-22 NOTE — Progress Notes (Signed)
Virtual Visit Consent   Kimberly Cameron, you are scheduled for a virtual visit with a [facility] provider today. Just as with appointments in the office, your consent must be obtained to participate. Your consent will be active for this visit and any virtual visit you may have with one of our providers in the next 365 days. If you have a MyChart account, a copy of this consent can be sent to you electronically.  As this is a virtual visit, video technology does not allow for your provider to perform a traditional examination. This may limit your provider's ability to fully assess your condition. If your provider identifies any concerns that need to be evaluated in person or the need to arrange testing (such as labs, EKG, etc.), we will make arrangements to do so. Although advances in technology are sophisticated, we cannot ensure that it will always work on either your end or our end. If the connection with a video visit is poor, the visit may have to be switched to a telephone visit. With either a video or telephone visit, we are not always able to ensure that we have a secure connection.  By engaging in this virtual visit, you consent to the provision of healthcare and authorize for your insurance to be billed (if applicable) for the services provided during this visit. Depending on your insurance coverage, you may receive a charge related to this service.  I need to obtain your verbal consent now. Are you willing to proceed with your visit today? Kimberly Cameron has provided verbal consent on 09/22/2021 for a virtual visit (video or telephone). Kimberly Mayo, NP  Date: 09/22/2021 12:40 PM  Virtual Visit via Video Note   I, Kimberly Cameron, connected with  Kimberly Cameron  ([id number], [birth year]) on 09/22/21 at 12:45 PM EDT by a video-enabled telemedicine application and verified that I am speaking with the correct person using two identifiers.  Location: Patient: Virtual Visit Location  Patient: Home Provider: Virtual Visit Location Provider: Home Office   I discussed the limitations of evaluation and management by telemedicine and the availability of in person appointments. The patient expressed understanding and agreed to proceed.    History of Present Illness: Kimberly Cameron is a 26 y.o. who identifies as a female who was assigned female at birth, and is being seen today for vaginal itching and some soreness. Denies vaginal discharge   HPI: Vaginal Itching The patient's primary symptoms include genital itching. The patient's pertinent negatives include no genital lesions, genital odor, genital rash, missed menses, pelvic pain, vaginal bleeding or vaginal discharge. This is a new problem. The current episode started in the past 7 days. The problem occurs constantly. The problem has been gradually worsening. The pain is mild. The problem affects both sides. She is not pregnant. Pertinent negatives include no abdominal pain, anorexia, back pain, chills, constipation, diarrhea, discolored urine, dysuria, fever, flank pain, frequency, headaches, hematuria, joint pain, joint swelling, nausea, painful intercourse, rash, sore throat, urgency or vomiting. The symptoms are aggravated by intercourse. She has tried antifungals for the symptoms. The treatment provided no relief. She is sexually active. No, her partner does not have an STD. She uses progestin injections for contraception. Her past medical history is significant for vaginosis. There is no history of an abdominal surgery, a Cesarean section, an ectopic pregnancy, endometriosis, a gynecological surgery, herpes simplex, menorrhagia, metrorrhagia, miscarriage, ovarian cysts, perineal abscess, PID, an STD or a terminated pregnancy.  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 (Patient not taking: Reported on 05/04/2021), Disp: 60 tablet, Rfl: 0   fluconazole (DIFLUCAN) 150 MG tablet, Take 1 tablet, by mouth, for symptoms; repeat in 4 days if symptoms continue., Disp: 1 tablet, Rfl: 0   hydrOXYzine (ATARAX/VISTARIL) 10 MG tablet, Take 1 tablet (10 mg total) by mouth 3 (three) times daily as needed for anxiety. (Patient not taking: Reported on 04/13/2021), Disp: 90 tablet, Rfl: 0   nystatin cream (MYCOSTATIN), Apply 1 application. topically 2 (two) times daily. Apply to external vagina/vulvar/inner thighs. Not for internal use., Disp: 30 g, Rfl: 0   pantoprazole (PROTONIX) 40 MG tablet, Take 1 tablet (40 mg total) by mouth daily. (Patient not taking: Reported on 04/13/2021), Disp: 90 tablet, Rfl: 3   sertraline (ZOLOFT) 50 MG tablet, Take 1 tablet (50 mg total) by mouth daily., Disp: 90 tablet, Rfl: 0   sucralfate (CARAFATE) 1 GM/10ML suspension, Take 10 mLs (1 g total) by mouth 3 times/day as needed-between meals & bedtime. (Patient not taking: Reported on 04/13/2021), Disp: 1200 mL, Rfl: 3  Observations/Objective: Patient is well-developed, well-nourished in no acute distress.  Resting comfortably  at home.  Head is normocephalic, atraumatic.  No labored breathing.  Speech is clear and coherent with logical content.  Patient is alert and oriented at baseline.    Assessment and Plan: 1. Yeast infection  - fluconazole  (DIFLUCAN) 150 MG tablet; Take 1 tablet, by mouth, for symptoms; repeat in 4 days if symptoms continue.  Dispense: 2 tablet; Refill: 0  -discussed causes of infection and precautions to be seen if not improved -info on avs   Reviewed side effects, risks and benefits of medication.    Patient acknowledged agreement and understanding of the plan.   Past Medical, Surgical, Social History, Allergies, and Medications have been Reviewed.   Follow Up Instructions: I discussed the assessment and treatment plan with the patient. The patient was provided an opportunity to ask questions and all were answered. The patient agreed with the plan and demonstrated an understanding of the instructions.  A copy of instructions were sent to the patient via MyChart unless otherwise noted below.     The patient was advised to call back or seek an in-person evaluation if the symptoms worsen or if the condition fails to improve as anticipated.  Time:  I spent 10 minutes with the patient via telehealth technology discussing the above problems/concerns.    Kimberly Mayo, NP

## 2021-09-22 NOTE — Patient Instructions (Signed)
Kimberly Cameron, thank you for joining [name], NP for today's virtual visit.  While this provider is not your primary care provider (PCP), if your PCP is located in our provider database this encounter information will be shared with them immediately following your visit.  Consent: (Patient) Kimberly Cameron provided verbal consent for this virtual visit at the beginning of the encounter.  Current Medications:  Current Outpatient Medications:    buPROPion (WELLBUTRIN XL) 150 MG 24 hr tablet, TAKE 1 TABLET BY MOUTH EVERY DAY (Patient not taking: Reported on 04/13/2021), Disp: 90 tablet, Rfl: 1   dexmethylphenidate (FOCALIN) 5 MG tablet, Take 1 tablet (5 mg total) by mouth 2 (two) times daily. (Patient not taking: Reported on 05/04/2021), Disp: 60 tablet, Rfl: 0   fluconazole (DIFLUCAN) 150 MG tablet, Take 1 tablet, by mouth, for symptoms; repeat in 4 days if symptoms continue., Disp: 2 tablet, Rfl: 0   hydrOXYzine (ATARAX/VISTARIL) 10 MG tablet, Take 1 tablet (10 mg total) by mouth 3 (three) times daily as needed for anxiety. (Patient not taking: Reported on 04/13/2021), Disp: 90 tablet, Rfl: 0   nystatin cream (MYCOSTATIN), Apply 1 application. topically 2 (two) times daily. Apply to external vagina/vulvar/inner thighs. Not for internal use., Disp: 30 g, Rfl: 0   pantoprazole (PROTONIX) 40 MG tablet, Take 1 tablet (40 mg total) by mouth daily. (Patient not taking: Reported on 04/13/2021), Disp: 90 tablet, Rfl: 3   sertraline (ZOLOFT) 50 MG tablet, Take 1 tablet (50 mg total) by mouth daily., Disp: 90 tablet, Rfl: 0   sucralfate (CARAFATE) 1 GM/10ML suspension, Take 10 mLs (1 g total) by mouth 3 times/day as needed-between meals & bedtime. (Patient not taking: Reported on 04/13/2021), Disp: 1200 mL, Rfl: 3   Medications ordered in this encounter:  Meds ordered this encounter  Medications   fluconazole (DIFLUCAN) 150 MG tablet    Sig: Take 1 tablet, by mouth, for symptoms; repeat in 4 days  if symptoms continue.    Dispense:  2 tablet    Refill:  0    Order Specific Question:   Supervising Provider    Answer:   Chase Picket [id number]     *If you need refills on other medications prior to your next appointment, please contact your pharmacy*  Follow-Up: Call back or seek an in-person evaluation if the symptoms worsen or if the condition fails to improve as anticipated.  Other Instructions  Vaginal Yeast Infection, Adult  Vaginal yeast infection is a condition that causes vaginal discharge as well as soreness, swelling, and redness (inflammation) of the vagina. This is a common condition. Some women get this infection frequently. What are the causes? This condition is caused by a change in the normal balance of the yeast (Candida) and normal bacteria that live in the vagina. This change causes an overgrowth of yeast, which causes the inflammation. What increases the risk? The condition is more likely to develop in women who: Take antibiotic medicines. Have diabetes. Take birth control pills. Are pregnant. Douche often. Have a weak body defense system (immune system). Have been taking steroid medicines for a long time. Frequently wear tight clothing. What are the signs or symptoms? Symptoms of this condition include: White, thick, creamy vaginal discharge. Swelling, itching, redness, and irritation of the vagina. The lips of the vagina (labia) may be affected as well. Pain or a burning feeling while urinating. Pain during sex. How is this diagnosed? This condition is diagnosed based on: Your medical history.  A physical exam. A pelvic exam. Your health care provider will examine a sample of your vaginal discharge under a microscope. Your health care provider may send this sample for testing to confirm the diagnosis. How is this treated? This condition is treated with medicine. Medicines may be over-the-counter or prescription. You may be told to use one or more  of the following: Medicine that is taken by mouth (orally). Medicine that is applied as a cream (topically). Medicine that is inserted directly into the vagina (suppository). Follow these instructions at home: Take or apply over-the-counter and prescription medicines only as told by your health care provider. Do not use tampons until your health care provider approves. Do not have sex until your infection has cleared. Sex can prolong or worsen your symptoms of infection. Ask your health care provider when it is safe to resume sexual activity. Keep all follow-up visits. This is important. How is this prevented?  Do not wear tight clothes, such as pantyhose or tight pants. Wear breathable cotton underwear. Do not use douches, perfumed soap, creams, or powders. Wipe from front to back after using the toilet. If you have diabetes, keep your blood sugar levels under control. Ask your health care provider for other ways to prevent yeast infections. Contact a health care provider if: You have a fever. Your symptoms go away and then return. Your symptoms do not get better with treatment. Your symptoms get worse. You have new symptoms. You develop blisters in or around your vagina. You have blood coming from your vagina and it is not your menstrual period. You develop pain in your abdomen. Summary Vaginal yeast infection is a condition that causes discharge as well as soreness, swelling, and redness (inflammation) of the vagina. This condition is treated with medicine. Medicines may be over-the-counter or prescription. Take or apply over-the-counter and prescription medicines only as told by your health care provider. Do not douche. Resume sexual activity or use of tampons as instructed by your health care provider. Contact a health care provider if your symptoms do not get better with treatment or your symptoms go away and then return. This information is not intended to replace advice given  to you by your health care provider. Make sure you discuss any questions you have with your health care provider. Document Revised: 03/10/2020 Document Reviewed: 03/10/2020 Elsevier Patient Education  [place].    If you have been instructed to have an in-person evaluation today at a local Urgent Care facility, please use the link below. It will take you to a list of all of our available [facility], including address, phone number and hours of operation. Please do not delay care.  [facility]  If you or a family member do not have a primary care provider, use the link below to schedule a visit and establish care. When you choose a [facility] primary care physician or advanced practice provider, you gain a long-term partner in health. Find a Primary Care Provider  Learn more about [facility] in-office and virtual care options: [facility]

## 2021-10-13 ENCOUNTER — Ambulatory Visit: Payer: 59 | Admitting: Family Medicine

## 2021-10-14 ENCOUNTER — Ambulatory Visit (INDEPENDENT_AMBULATORY_CARE_PROVIDER_SITE_OTHER): Payer: 59 | Admitting: Family Medicine

## 2021-10-14 DIAGNOSIS — Z3042 Encounter for surveillance of injectable contraceptive: Secondary | ICD-10-CM | POA: Diagnosis not present

## 2021-10-14 MED ORDER — MEDROXYPROGESTERONE ACETATE 150 MG/ML IM SUSP
150.0000 mg | Freq: Once | INTRAMUSCULAR | Status: AC
  Administered 2021-10-14: 150 mg via INTRAMUSCULAR

## 2021-10-14 NOTE — Progress Notes (Signed)
Patient is here for depo shot.

## 2021-10-16 NOTE — Progress Notes (Unsigned)
      Established patient visit   Patient: Kimberly Cameron   DOB: 03/18/1995   26 y.o. Female  MRN: [id number] Visit Date: 10/19/2021  Today's healthcare provider: Gwyneth Sprout, FNP   No chief complaint on file.  Subjective    HPI  ***  Medications: Outpatient Medications Prior to Visit  Medication Sig   buPROPion (WELLBUTRIN XL) 150 MG 24 hr tablet TAKE 1 TABLET BY MOUTH EVERY DAY (Patient not taking: Reported on 04/13/2021)   dexmethylphenidate (FOCALIN) 5 MG tablet Take 1 tablet (5 mg total) by mouth 2 (two) times daily. (Patient not taking: Reported on 05/04/2021)   fluconazole (DIFLUCAN) 150 MG tablet Take 1 tablet, by mouth, for symptoms; repeat in 4 days if symptoms continue.   hydrOXYzine (ATARAX/VISTARIL) 10 MG tablet Take 1 tablet (10 mg total) by mouth 3 (three) times daily as needed for anxiety. (Patient not taking: Reported on 04/13/2021)   nystatin cream (MYCOSTATIN) Apply 1 application. topically 2 (two) times daily. Apply to external vagina/vulvar/inner thighs. Not for internal use.   pantoprazole (PROTONIX) 40 MG tablet Take 1 tablet (40 mg total) by mouth daily. (Patient not taking: Reported on 04/13/2021)   sertraline (ZOLOFT) 50 MG tablet Take 1 tablet (50 mg total) by mouth daily.   sucralfate (CARAFATE) 1 GM/10ML suspension Take 10 mLs (1 g total) by mouth 3 times/day as needed-between meals & bedtime. (Patient not taking: Reported on 04/13/2021)   No facility-administered medications prior to visit.    Review of Systems  {Labs  Heme  Chem  Endocrine  Serology  Results Review (optional):23779}   Objective    There were no vitals taken for this visit. {Show previous vital signs (optional):23777}  Physical Exam  ***  No results found for any visits on 10/19/21.  Assessment & Plan     ***  No follow-ups on file.      {provider attestation***:1}   Gwyneth Sprout, [place] [phone] (phone) [phone] (fax)  [place]

## 2021-10-19 ENCOUNTER — Ambulatory Visit: Payer: 59 | Admitting: Family Medicine

## 2021-10-19 ENCOUNTER — Other Ambulatory Visit (HOSPITAL_COMMUNITY)
Admission: RE | Admit: 2021-10-19 | Discharge: 2021-10-19 | Disposition: A | Discharge status: Home / Self Care | Payer: 59 | Source: Ambulatory Visit | Attending: Family Medicine | Admitting: Family Medicine

## 2021-10-19 ENCOUNTER — Encounter: Payer: Self-pay | Admitting: Family Medicine

## 2021-10-19 VITALS — BP 95/70 | HR 84 | Resp 16 | Ht 68.0 in | Wt 190.0 lb

## 2021-10-19 DIAGNOSIS — Z113 Encounter for screening for infections with a predominantly sexual mode of transmission: Secondary | ICD-10-CM | POA: Diagnosis present

## 2021-10-19 DIAGNOSIS — R300 Dysuria: Secondary | ICD-10-CM | POA: Insufficient documentation

## 2021-10-19 LAB — POCT URINALYSIS DIPSTICK
Bilirubin, UA: NEGATIVE
Blood, UA: NEGATIVE
Glucose, UA: NEGATIVE
Ketones, UA: NEGATIVE
Leukocytes, UA: NEGATIVE
Nitrite, UA: NEGATIVE
Protein, UA: NEGATIVE
Spec Grav, UA: 1.01 (ref 1.010–1.025)
Urobilinogen, UA: 0.2 E.U./dL
pH, UA: 7 (ref 5.0–8.0)

## 2021-10-19 NOTE — Assessment & Plan Note (Signed)
Complaints of vaginal/urinary discomfort Is sexually active Self collect for NuSwab ancillary given change in urinary appearance and normal UA

## 2021-10-19 NOTE — Assessment & Plan Note (Signed)
UA norma; continue to encourage fluids to assist with pH elevation and prevention of "cloudy" urine reports; urine was clear on exam today -CVA tenderness -bladder pain/distention

## 2021-10-20 ENCOUNTER — Telehealth: Payer: Self-pay | Admitting: *Deleted

## 2021-10-20 LAB — CERVICOVAGINAL ANCILLARY ONLY
Bacterial Vaginitis (gardnerella): NEGATIVE
Candida Glabrata: NEGATIVE
Candida Vaginitis: NEGATIVE
Chlamydia: NEGATIVE
Comment: NEGATIVE
Comment: NEGATIVE
Comment: NEGATIVE
Comment: NEGATIVE
Comment: NEGATIVE
Comment: NORMAL
Neisseria Gonorrhea: NEGATIVE
Trichomonas: NEGATIVE

## 2021-10-20 NOTE — Telephone Encounter (Signed)
Please advise 

## 2021-10-20 NOTE — Telephone Encounter (Signed)
Copied from [place] [id number]. Topic: General - Other >> Oct 20, 2021 11:14 AM Chapman Fitch wrote: Reason for CRM: Cytology is Unable to do HPV and Herpes with a swab  ./ please advise

## 2021-10-21 NOTE — Addendum Note (Signed)
Addended by: Smitty Knudsen on: 10/21/2021 01:45 PM   Modules accepted: Orders

## 2021-10-21 NOTE — Progress Notes (Signed)
Negative STI panel.   Kimberly Cameron, [address] [place], [place] [address] [phone] (phone) [phone] (fax) [place]

## 2021-10-26 ENCOUNTER — Other Ambulatory Visit: Payer: Self-pay | Admitting: Family Medicine

## 2021-11-09 ENCOUNTER — Other Ambulatory Visit: Payer: Self-pay | Admitting: Family Medicine

## 2021-11-17 ENCOUNTER — Encounter: Payer: Self-pay | Admitting: Obstetrics and Gynecology

## 2021-11-20 ENCOUNTER — Telehealth: Payer: Self-pay

## 2021-11-20 NOTE — Telephone Encounter (Signed)
FYI

## 2021-11-20 NOTE — Telephone Encounter (Signed)
Copied from [facility] [id number]. Topic: Referral - Question >> Nov 20, 2021 10:05 AM Marlow Baars wrote: Reason for CRM: Kimberly Cameron with [facility] called in stating they have made numerous attempts to contact patient with no success. They will close referral and it will still be good for a year if needed. Please assist further

## 2021-12-17 ENCOUNTER — Telehealth (INDEPENDENT_AMBULATORY_CARE_PROVIDER_SITE_OTHER): Payer: 59 | Admitting: Physician Assistant

## 2021-12-17 ENCOUNTER — Encounter: Payer: Self-pay | Admitting: Physician Assistant

## 2021-12-17 DIAGNOSIS — J329 Chronic sinusitis, unspecified: Secondary | ICD-10-CM | POA: Diagnosis not present

## 2021-12-17 NOTE — Progress Notes (Addendum)
I,Sha'taria Tyson,acting as a Neurosurgeon for [employer], PA-C.,have documented all relevant documentation on the behalf of Debera Lat, PA-C,as directed by  [employer], PA-C while in the presence of [employer], PA-C.    Established patient visit   Patient: Kimberly Cameron   DOB: May 13, 1995   26 y.o. Female  MRN: [id number] Visit Date: 12/17/2021  Today's healthcare provider: Debera Lat, PA-C   CC: sinus problems  Subjective    HPI  Sinus pressure for at least a month and has not gone away. Pain in throat cleared up for a few weeks and is now coming back. Patient has taken advil, alka seltzer tablets with mild relief. Patient states when she wakes up in the morning it is not as bad but gets worse through out the day to the point once she is home she doesn't really want to talk.   Medications: Outpatient Medications Prior to Visit  Medication Sig   buPROPion (WELLBUTRIN XL) 150 MG 24 hr tablet TAKE 1 TABLET BY MOUTH EVERY DAY   dexmethylphenidate (FOCALIN) 5 MG tablet Take 1 tablet (5 mg total) by mouth 2 (two) times daily.   fluconazole (DIFLUCAN) 150 MG tablet Take 1 tablet, by mouth, for symptoms; repeat in 4 days if symptoms continue.   hydrOXYzine (ATARAX/VISTARIL) 10 MG tablet Take 1 tablet (10 mg total) by mouth 3 (three) times daily as needed for anxiety.   nystatin cream (MYCOSTATIN) Apply 1 application. topically 2 (two) times daily. Apply to external vagina/vulvar/inner thighs. Not for internal use.   pantoprazole (PROTONIX) 40 MG tablet Take 1 tablet (40 mg total) by mouth daily.   sertraline (ZOLOFT) 50 MG tablet TAKE 1 TABLET BY MOUTH EVERY DAY   sucralfate (CARAFATE) 1 GM/10ML suspension Take 10 mLs (1 g total) by mouth 3 times/day as needed-between meals & bedtime.   No facility-administered medications prior to visit.    Review of Systems  All other systems reviewed and are negative. Except see HPI     Objective    There were no vitals taken for  this visit.   Physical Exam Vitals reviewed.  Constitutional:      General: She is not in acute distress.    Appearance: Normal appearance. She is well-developed. She is not diaphoretic.  HENT:     Head: Normocephalic and atraumatic.  Neck:     Thyroid: No thyromegaly.  Skin:    Findings: No rash.  Neurological:     General: No focal deficit present.     Mental Status: She is alert and oriented to person, place, and time. Mental status is at baseline.  Psychiatric:        Behavior: Behavior normal.     No results found for any visits on 12/17/21.  Assessment & Plan     1. Rhinosinusitis Could be a new problem of allergic rhinosinusitis vs viral rhinosinusitis vs UTI Advised symptomatic treatment Increase fluids. Rest.Saline nasal spray. Mucinex as directed. Humidifier in bedroom. Flonase OTC . Warm salt gargles and hot tea with honey. Tenting. Advised to stop taking alka-seltzer and continue with advil and tylenol if needed for pain and fever.  The patient was advised to call back if symptoms worsen/fever, chest congestion, shortness of breath or seek an in-person evaluation if the symptoms worsen or if the condition fails to improve as anticipated.  I discussed the assessment and treatment plan with the patient. The patient was provided an opportunity to ask questions and all were answered.  The patient agreed with the plan and demonstrated an understanding of the instructions.  The entirety of the information documented in the History of Present Illness, Review of Systems and Physical Exam were personally obtained by me. Portions of this information were initially documented by the CMA and reviewed by me for thoroughness and accuracy.   Debera Lat, [facility], MMS [facility] [phone] (phone) [phone] (fax)

## 2021-12-22 NOTE — Addendum Note (Signed)
Addended by: Debera Lat on: 12/22/2021 01:21 PM   Modules accepted: Level of Service

## 2021-12-22 NOTE — Progress Notes (Signed)
MyChart Video Visit    Virtual Visit via Video Note   This format is felt to be most appropriate for this patient at this time. Physical exam was limited by quality of the video and audio technology used for the visit.   Patient location: home? Provider location: [facility]  I discussed the limitations of evaluation and management by telemedicine and the availability of in person appointments. The patient expressed understanding and agreed to proceed.  Patient: Kimberly Cameron   DOB: 06-07-1995   26 y.o. Female  MRN: [id number] Visit Date: 12/17/2021  Today's healthcare provider: Debera Lat, PA-C   CC: sinus problems  Subjective    HPI  Sinus pressure for at least a month and has not gone away. Pain in throat cleared up for a few weeks and is now coming back. Patient has taken advil, alka seltzer tablets with mild relief. Patient states when she wakes up in the morning it is not as bad but gets worse through out the day to the point once she is home she doesn't really want to talk.    Medications: Outpatient Medications Prior to Visit  Medication Sig   dexmethylphenidate (FOCALIN) 5 MG tablet Take 1 tablet (5 mg total) by mouth 2 (two) times daily.   sertraline (ZOLOFT) 50 MG tablet TAKE 1 TABLET BY MOUTH EVERY DAY   buPROPion (WELLBUTRIN XL) 150 MG 24 hr tablet TAKE 1 TABLET BY MOUTH EVERY DAY (Patient not taking: Reported on 12/17/2021)   fluconazole (DIFLUCAN) 150 MG tablet Take 1 tablet, by mouth, for symptoms; repeat in 4 days if symptoms continue. (Patient not taking: Reported on 12/17/2021)   hydrOXYzine (ATARAX/VISTARIL) 10 MG tablet Take 1 tablet (10 mg total) by mouth 3 (three) times daily as needed for anxiety. (Patient not taking: Reported on 12/17/2021)   nystatin cream (MYCOSTATIN) Apply 1 application. topically 2 (two) times daily. Apply to external vagina/vulvar/inner thighs. Not for internal use. (Patient not taking: Reported on 12/17/2021)   pantoprazole  (PROTONIX) 40 MG tablet Take 1 tablet (40 mg total) by mouth daily. (Patient not taking: Reported on 12/17/2021)   sucralfate (CARAFATE) 1 GM/10ML suspension Take 10 mLs (1 g total) by mouth 3 times/day as needed-between meals & bedtime. (Patient not taking: Reported on 12/17/2021)   No facility-administered medications prior to visit.    Review of Systems  All other systems reviewed and are negative. Except see HPI    Objective    There were no vitals taken for this visit.     Physical Exam  There were no vitals taken for this visit. Constitutional:      General: She is not in acute distress.    Appearance: Normal appearance. She is well-developed. She is not diaphoretic.  HENT:     Head: Normocephalic and atraumatic.  Neck:     Thyroid: No thyromegaly.  Skin:    Findings: No rash.  Neurological:     General: No focal deficit present.     Mental Status: She is alert and oriented to person, place, and time. Mental status is at baseline.  Psychiatric:        Behavior: Behavior normal.    Assessment & Plan      1. Rhinosinusitis Could be a new problem of allergic rhinosinusitis vs viral rhinosinusitis vs UTI Advised symptomatic treatment Increase fluids. Rest.Saline nasal spray. Mucinex as directed. Humidifier in bedroom. Flonase OTC . Warm salt gargles and hot tea with honey. Tenting. Advised to stop taking alka-seltzer  and continue with advil and tylenol if needed for pain and fever.  The patient was advised to call back if symptoms worsen/fever, chest congestion, shortness of breath or seek an in-person evaluation if the symptoms worsen or if the condition fails to improve as anticipated.   I discussed the assessment and treatment plan with the patient. The patient was provided an opportunity to ask questions and all were answered. The patient agreed with the plan and demonstrated an understanding of the instructions.    I provided 21 minutes of non-face-to-face  time during this encounter.  The entirety of the information documented in the History of Present Illness, Review of Systems and Physical Exam were personally obtained by me. Portions of this information were initially documented by the CMA and reviewed by me for thoroughness and accuracy.   Debera Lat, [facility], MMS [facility] [phone] (phone) [phone] (fax)

## 2021-12-22 NOTE — Addendum Note (Signed)
Addended by: Debera Lat on: 12/22/2021 07:32 AM   Modules accepted: Level of Service

## 2021-12-30 ENCOUNTER — Other Ambulatory Visit: Payer: Self-pay | Admitting: Family Medicine

## 2021-12-31 ENCOUNTER — Ambulatory Visit (INDEPENDENT_AMBULATORY_CARE_PROVIDER_SITE_OTHER): Payer: 59 | Admitting: Family Medicine

## 2021-12-31 ENCOUNTER — Encounter: Payer: Self-pay | Admitting: Family Medicine

## 2021-12-31 VITALS — BP 129/78 | HR 83 | Temp 97.8°F | Ht 68.0 in | Wt 197.8 lb

## 2021-12-31 DIAGNOSIS — F339 Major depressive disorder, recurrent, unspecified: Secondary | ICD-10-CM | POA: Diagnosis not present

## 2021-12-31 DIAGNOSIS — Z3042 Encounter for surveillance of injectable contraceptive: Secondary | ICD-10-CM | POA: Insufficient documentation

## 2021-12-31 DIAGNOSIS — Z0000 Encounter for general adult medical examination without abnormal findings: Secondary | ICD-10-CM | POA: Insufficient documentation

## 2021-12-31 DIAGNOSIS — R69 Illness, unspecified: Secondary | ICD-10-CM | POA: Diagnosis not present

## 2021-12-31 MED ORDER — MEDROXYPROGESTERONE ACETATE 150 MG/ML IM SUSP
150.0000 mg | Freq: Once | INTRAMUSCULAR | Status: AC
  Administered 2021-12-31: 150 mg via INTRAMUSCULAR

## 2021-12-31 MED ORDER — SERTRALINE HCL 50 MG PO TABS: 50.0000 mg | ORAL_TABLET | Freq: Every day | ORAL | 1 refills | Status: DC

## 2021-12-31 NOTE — Assessment & Plan Note (Signed)
UTD on vision Due for dental; plans to see one in 2024 Plans to schedule PAP at [facility] Things to do to keep yourself healthy  - Exercise at least 30-45 minutes a day, 3-4 days a week.  - Eat a low-fat diet with lots of fruits and vegetables, up to 7-9 servings per day.  - Seatbelts can save your life. Wear them always.  - Smoke detectors on every level of your home, check batteries every year.  - Eye Doctor - have an eye exam every 1-2 years  - Safe sex - if you may be exposed to STDs, use a condom.  - Alcohol -  If you drink, do it moderately, less than 2 drinks per day.  - Health Care Power of Attorney. Choose someone to speak for you if you are not able.  - Depression is common in our stressful world.If you're feeling down or losing interest in things you normally enjoy, please come in for a visit.  - Violence - If anyone is threatening or hurting you, please call immediately.

## 2021-12-31 NOTE — Assessment & Plan Note (Signed)
Chronic, stable Continue current dose of Zoloft 50 mg

## 2021-12-31 NOTE — Progress Notes (Signed)
Complete physical exam  Patient: Kimberly Cameron   DOB: 1995-04-18   26 y.o. Female  MRN: [id number] Visit Date: 12/31/2021  Today's healthcare provider: Gwyneth Sprout, FNP  Re Introduced to nurse practitioner role and practice setting.  All questions answered.  Discussed provider/patient relationship and expectations.  Subjective    Kimberly Cameron is a 26 y.o. female who presents today for a complete physical exam.  She reports consuming a general diet. The patient does not participate in regular exercise at present. She generally feels well. She reports sleeping well. She does not have additional problems to discuss today.   HPI  Depo, Last given 10/11  Past Medical History:  Diagnosis Date   Anxiety    GERD (gastroesophageal reflux disease)    Past Surgical History:  Procedure Laterality Date   TONSILLECTOMY     in 7th grade   Social History   Socioeconomic History   Marital status: Single    Spouse name: Not on file   Number of children: Not on file   Years of education: Not on file   Highest education level: Not on file  Occupational History   Not on file  Tobacco Use   Smoking status: Never   Smokeless tobacco: Never  Substance and Sexual Activity   Alcohol use: Yes   Drug use: No   Sexual activity: Yes    Partners: Male    Birth control/protection: Condom  Other Topics Concern   Not on file  Social History Narrative   Not on file   Social Determinants of Health   Financial Resource Strain: Not on file  Food Insecurity: Not on file  Transportation Needs: Not on file  Physical Activity: Not on file  Stress: Not on file  Social Connections: Not on file  Intimate Partner Violence: Not on file   Family Status  Relation Name Status   Father  Alive   Brother  Alive   Family History  Problem Relation Age of Onset   Diabetes Father    Clotting disorder Brother    No Known Allergies  Patient Care Team: Gwyneth Sprout, FNP as PCP - General  (Family Medicine)   Medications: Outpatient Medications Prior to Visit  Medication Sig   sertraline (ZOLOFT) 50 MG tablet TAKE 1 TABLET BY MOUTH EVERY DAY   [DISCONTINUED] dexmethylphenidate (FOCALIN) 5 MG tablet Take 1 tablet (5 mg total) by mouth 2 (two) times daily.   [DISCONTINUED] buPROPion (WELLBUTRIN XL) 150 MG 24 hr tablet TAKE 1 TABLET BY MOUTH EVERY DAY (Patient not taking: Reported on 12/17/2021)   [DISCONTINUED] fluconazole (DIFLUCAN) 150 MG tablet Take 1 tablet, by mouth, for symptoms; repeat in 4 days if symptoms continue. (Patient not taking: Reported on 12/17/2021)   [DISCONTINUED] hydrOXYzine (ATARAX/VISTARIL) 10 MG tablet Take 1 tablet (10 mg total) by mouth 3 (three) times daily as needed for anxiety. (Patient not taking: Reported on 12/17/2021)   [DISCONTINUED] nystatin cream (MYCOSTATIN) Apply 1 application. topically 2 (two) times daily. Apply to external vagina/vulvar/inner thighs. Not for internal use. (Patient not taking: Reported on 12/17/2021)   [DISCONTINUED] pantoprazole (PROTONIX) 40 MG tablet Take 1 tablet (40 mg total) by mouth daily. (Patient not taking: Reported on 12/17/2021)   [DISCONTINUED] sucralfate (CARAFATE) 1 GM/10ML suspension Take 10 mLs (1 g total) by mouth 3 times/day as needed-between meals & bedtime. (Patient not taking: Reported on 12/17/2021)   No facility-administered medications prior to visit.   Review of Systems   Objective  BP 129/78 (BP Location: Right Arm, Patient Position: Sitting, Cuff Size: Normal)   Pulse 83   Temp 97.8 F (36.6 C) (Oral)   Ht _0  (1.727 m)   Wt 197 lb 12.8 oz (89.7 kg)   SpO2 99%   BMI 30.08 kg/m   Physical Exam Vitals and nursing note reviewed.  Constitutional:      General: She is awake. She is not in acute distress.    Appearance: Normal appearance. She is well-developed and well-groomed. She is obese. She is not ill-appearing, toxic-appearing or diaphoretic.  HENT:     Head: Normocephalic and  atraumatic.     Jaw: There is normal jaw occlusion. No trismus, tenderness, swelling or pain on movement.     Right Ear: Hearing, tympanic membrane, ear canal and external ear normal. There is no impacted cerumen.     Left Ear: Hearing, tympanic membrane, ear canal and external ear normal. There is no impacted cerumen.     Nose: Nose normal. No congestion or rhinorrhea.     Right Turbinates: Not enlarged, swollen or pale.     Left Turbinates: Not enlarged, swollen or pale.     Right Sinus: No maxillary sinus tenderness or frontal sinus tenderness.     Left Sinus: No maxillary sinus tenderness or frontal sinus tenderness.     Mouth/Throat:     Lips: Pink.     Mouth: Mucous membranes are moist. No injury.     Tongue: No lesions.     Pharynx: Oropharynx is clear. Uvula midline. No pharyngeal swelling, oropharyngeal exudate, posterior oropharyngeal erythema or uvula swelling.     Tonsils: No tonsillar exudate or tonsillar abscesses.  Eyes:     General: Lids are normal. Lids are everted, no foreign bodies appreciated. Vision grossly intact. Gaze aligned appropriately. No allergic shiner or visual field deficit.       Right eye: No discharge.        Left eye: No discharge.     Extraocular Movements: Extraocular movements intact.     Conjunctiva/sclera: Conjunctivae normal.     Right eye: Right conjunctiva is not injected. No exudate.    Left eye: Left conjunctiva is not injected. No exudate.    Pupils: Pupils are equal, round, and reactive to light.  Neck:     Thyroid: No thyroid mass, thyromegaly or thyroid tenderness.     Vascular: No carotid bruit.     Trachea: Trachea normal.  Cardiovascular:     Rate and Rhythm: Normal rate and regular rhythm.     Pulses: Normal pulses.          Carotid pulses are 2+ on the right side and 2+ on the left side.      Radial pulses are 2+ on the right side and 2+ on the left side.       Dorsalis pedis pulses are 2+ on the right side and 2+ on the left  side.       Posterior tibial pulses are 2+ on the right side and 2+ on the left side.     Heart sounds: Normal heart sounds, S1 normal and S2 normal. No murmur heard.    No friction rub. No gallop.  Pulmonary:     Effort: Pulmonary effort is normal. No respiratory distress.     Breath sounds: Normal breath sounds and air entry. No stridor. No wheezing, rhonchi or rales.  Chest:     Chest wall: No tenderness.  Abdominal:  General: Abdomen is flat. Bowel sounds are normal. There is no distension.     Palpations: Abdomen is soft. There is no mass.     Tenderness: There is no abdominal tenderness. There is no right CVA tenderness, left CVA tenderness, guarding or rebound.     Hernia: No hernia is present.  Genitourinary:    Comments: Exam deferred; denies complaints Musculoskeletal:        General: No swelling, tenderness, deformity or signs of injury. Normal range of motion.     Cervical back: Full passive range of motion without pain, normal range of motion and neck supple. No edema, rigidity or tenderness. No muscular tenderness.     Right lower leg: No edema.     Left lower leg: No edema.  Lymphadenopathy:     Cervical: No cervical adenopathy.     Right cervical: No superficial, deep or posterior cervical adenopathy.    Left cervical: No superficial, deep or posterior cervical adenopathy.  Skin:    General: Skin is warm and dry.     Capillary Refill: Capillary refill takes less than 2 seconds.     Coloration: Skin is not jaundiced or pale.     Findings: No bruising, erythema, lesion or rash.  Neurological:     General: No focal deficit present.     Mental Status: She is alert and oriented to person, place, and time. Mental status is at baseline.     GCS: GCS eye subscore is 4. GCS verbal subscore is 5. GCS motor subscore is 6.     Sensory: Sensation is intact. No sensory deficit.     Motor: Motor function is intact. No weakness.     Coordination: Coordination is intact.  Coordination normal.     Gait: Gait is intact. Gait normal.  Psychiatric:        Attention and Perception: Attention and perception normal.        Mood and Affect: Mood and affect normal.        Speech: Speech normal.        Behavior: Behavior normal. Behavior is cooperative.        Thought Content: Thought content normal.        Cognition and Memory: Cognition and memory normal.        Judgment: Judgment normal.     Last depression screening scores    10/19/2021    8:43 AM 04/13/2021    2:00 PM 10/24/2020    9:05 AM  PHQ 2/9 Scores  PHQ - 2 Score 0 0 1  PHQ- 9 Score _0 Last fall risk screening    10/19/2021    8:43 AM  Fall Risk   Falls in the past year? 0  Number falls in past yr: 0  Injury with Fall? 0  Risk for fall due to : No Fall Risks  Follow up Falls evaluation completed   Last Audit-C alcohol use screening    10/19/2021    8:44 AM  Alcohol Use Disorder Test (AUDIT)  1. How often do you have a drink containing alcohol? 2  2. How many drinks containing alcohol do you have on a typical day when you are drinking? 0  3. How often do you have six or more drinks on one occasion? 0  AUDIT-C Score 2   A score of 3 or more in women, and 4 or more in men indicates increased risk for alcohol abuse, EXCEPT if all of the points are  from question 1   No results found for any visits on 12/31/21.  Assessment & Plan    Routine Health Maintenance and Physical Exam  Exercise Activities and Dietary recommendations  Goals   None     Immunization History  Administered Date(s) Administered   DTaP 09/29/1994, 11/26/1994, 02/04/1995, 11/15/1995, 01/06/1999   HIB (PRP-OMP) 09/29/1994, 11/26/1994, 02/04/1995, 11/15/1995   Hepatitis B 1994/09/08, 09/29/1994, 04/28/1995   IPV 09/29/1994, 11/26/1994, 02/04/1995, 01/06/1999   Influenza Nasal 11/27/2012   MMR 08/10/1995, 01/06/1999   Meningococcal Conjugate 02/23/2008   Tdap 11/27/2012   Typhoid Live 02/21/2015    Varicella 08/10/1995, 03/22/1999   Yellow Fever 02/21/2015    Health Maintenance  Topic Date Due   COVID-19 Vaccine (1) Never done   HIV Screening  Never done   Hepatitis C Screening  Never done   PAP-Cervical Cytology Screening  Never done   PAP SMEAR-Modifier  Never done   INFLUENZA VACCINE  04/04/2022 (Originally 08/04/2021)   DTaP/Tdap/Td (7 - Td or Tdap) 11/28/2022   HPV VACCINES  Aged Out    Discussed health benefits of physical activity, and encouraged her to engage in regular exercise appropriate for her age and condition.  Problem List Items Addressed This Visit       Other   Annual physical exam - Primary    UTD on vision Due for dental; plans to see one in 2024 Plans to schedule PAP at [facility] Things to do to keep yourself healthy  - Exercise at least 30-45 minutes a day, 3-4 days a week.  - Eat a low-fat diet with lots of fruits and vegetables, up to 7-9 servings per day.  - Seatbelts can save your life. Wear them always.  - Smoke detectors on every level of your home, check batteries every year.  - Eye Doctor - have an eye exam every 1-2 years  - Safe sex - if you may be exposed to STDs, use a condom.  - Alcohol -  If you drink, do it moderately, less than 2 drinks per day.  - [place]. Choose someone to speak for you if you are not able.  - Depression is common in our stressful world.If you're feeling down or losing interest in things you normally enjoy, please come in for a visit.  - Violence - If anyone is threatening or hurting you, please call immediately.       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    Vonna Kotyk, FNP, have reviewed all documentation for this visit. The  documentation on 12/31/21 for the exam, diagnosis, procedures, and orders are all accurate and complete.  Gwyneth Sprout, [place] [phone] (phone) [phone] (fax)  [place]

## 2021-12-31 NOTE — Assessment & Plan Note (Signed)
No concerns with previous injections; repeat in 12-13 weeks

## 2022-01-28 ENCOUNTER — Encounter: Payer: Self-pay | Admitting: Advanced Practice Midwife

## 2022-01-28 ENCOUNTER — Other Ambulatory Visit (HOSPITAL_COMMUNITY)
Admission: RE | Admit: 2022-01-28 | Discharge: 2022-01-28 | Disposition: A | Discharge status: Home / Self Care | Payer: 59 | Source: Ambulatory Visit | Attending: Advanced Practice Midwife | Admitting: Advanced Practice Midwife

## 2022-01-28 ENCOUNTER — Ambulatory Visit: Payer: 59 | Admitting: Advanced Practice Midwife

## 2022-01-28 VITALS — BP 120/80 | Ht 68.0 in | Wt 196.0 lb

## 2022-01-28 DIAGNOSIS — Z113 Encounter for screening for infections with a predominantly sexual mode of transmission: Secondary | ICD-10-CM | POA: Insufficient documentation

## 2022-01-28 DIAGNOSIS — Z124 Encounter for screening for malignant neoplasm of cervix: Secondary | ICD-10-CM | POA: Insufficient documentation

## 2022-01-28 DIAGNOSIS — Z1159 Encounter for screening for other viral diseases: Secondary | ICD-10-CM | POA: Diagnosis not present

## 2022-01-28 DIAGNOSIS — R69 Illness, unspecified: Secondary | ICD-10-CM | POA: Diagnosis not present

## 2022-01-28 DIAGNOSIS — N9089 Other specified noninflammatory disorders of vulva and perineum: Secondary | ICD-10-CM | POA: Diagnosis not present

## 2022-01-28 NOTE — Progress Notes (Signed)
Patient ID: Kimberly Cameron, female   DOB: 01/20/1995, 27 y.o.   MRN: [id number]  Reason for Consult: Bartholin's Cyst   Subjective:  HPI:  Kimberly Cameron is a 27 y.o. female being seen for a labial lump that has resolved. She thinks it may have been a bartholin's cyst and she was concerned that it could have been related to an STD. It was painful and had a cyst like appearance. She has never had a PAP smear- says her PCP has been encouraging her to have PAP. She agrees to have that done today and she accepts STD screening. She is in a monogamous relationship for the past 6 months and does not have concerns for STDs, however, she does not use condoms- thinks she had an allergic reaction. She has some acute vaginal irritation after intercourse. Had a UTI once after using coconut oil for lubrication. She uses Depo for birth control. She mentions an area of tenderness on her left lower breast a couple days ago that felt like a bruise. Her dog may have jumped on her. She has not changed bras or had trauma otherwise. Discussed keeping an eye on it and consider imaging for worsening or if it doesn't get better.  Past Medical History:  Diagnosis Date   Anxiety    GERD (gastroesophageal reflux disease)    Family History  Problem Relation Age of Onset   Diabetes Father    Clotting disorder Brother    Past Surgical History:  Procedure Laterality Date   TONSILLECTOMY     in 7th grade    Short Social History:  Social History   Tobacco Use   Smoking status: Never   Smokeless tobacco: Never  Substance Use Topics   Alcohol use: Yes    No Known Allergies  Current Outpatient Medications  Medication Sig Dispense Refill   dexmethylphenidate (FOCALIN) 2.5 MG tablet Take 2.5 mg by mouth 2 (two) times daily.     sertraline (ZOLOFT) 50 MG tablet Take 1 tablet (50 mg total) by mouth daily. 90 tablet 1   No current facility-administered medications for this visit.    Review of Systems   Constitutional:  Negative for chills and fever.  HENT:  Negative for congestion, ear discharge, ear pain, hearing loss, sinus pain and sore throat.   Eyes:  Negative for blurred vision and double vision.  Respiratory:  Negative for cough, shortness of breath and wheezing.   Cardiovascular:  Negative for chest pain, palpitations and leg swelling.  Gastrointestinal:  Negative for abdominal pain, blood in stool, constipation, diarrhea, heartburn, melena, nausea and vomiting.  Genitourinary:  Negative for dysuria, flank pain, frequency, hematuria and urgency.       Positive for vaginal irritation with intercourse  Musculoskeletal:  Negative for back pain, joint pain and myalgias.  Skin:  Negative for itching and rash.  Neurological:  Negative for dizziness, tingling, tremors, sensory change, speech change, focal weakness, seizures, loss of consciousness, weakness and headaches.  Endo/Heme/Allergies:  Negative for environmental allergies. Does not bruise/bleed easily.  Psychiatric/Behavioral:  Negative for depression, hallucinations, memory loss, substance abuse and suicidal ideas. The patient is not nervous/anxious and does not have insomnia.   Breast: positive for bruised feeling lower left breast      Objective:  Objective   Vitals:   01/28/22 0914  BP: 120/80  Weight: 196 lb (88.9 kg)  Height: 5\' 8"  (1.727 m)   Body mass index is 29.8 kg/m. Constitutional: Well nourished, well developed female  in no acute distress.  HEENT: normal Skin: Warm and dry.  Extremity:  no edema   Respiratory:  Normal respiratory effort Psych: Alert and Oriented x3. No memory deficits. Normal mood and affect.    Pelvic exam:  is not limited by body habitus EGBUS: within normal limits, no current evidence of bartholin cyst or any other lesion Vagina: within normal limits and with normal mucosa  Cervix: normal appearance/PAP specimen collected   Assessment/Plan:     27 y.o. G0 P0, cervical cancer  screening, STD screening  PAPtima Blood work: STD screens Follow up as needed after labs result   [facility] 01/28/2022, 11:07 AM

## 2022-01-29 ENCOUNTER — Telehealth: Payer: Self-pay

## 2022-01-29 LAB — HEPATITIS B SURFACE ANTIBODY,QUALITATIVE: Hep B Surface Ab, Qual: NONREACTIVE

## 2022-01-29 LAB — RPR QUALITATIVE: RPR Ser Ql: NONREACTIVE

## 2022-01-29 LAB — HEPATITIS C ANTIBODY: Hep C Virus Ab: NONREACTIVE

## 2022-01-29 NOTE — Telephone Encounter (Signed)
Spoke with patient. Ov scheduled 02/04/22 @ 9:20am.

## 2022-01-29 NOTE — Telephone Encounter (Signed)
Copied from [place] [id number]. Topic: Appointment Scheduling - Scheduling Inquiry for Clinic >> Jan 29, 2022  9:41 AM Sabas Sous wrote: Reason for CRM: Pt called reporting that she needs to be seen for allergy testing, recommended by her PCP.   Best contact: [phone]

## 2022-02-01 LAB — CYTOLOGY - PAP
Chlamydia: NEGATIVE
Comment: NEGATIVE
Comment: NEGATIVE
Comment: NORMAL
Diagnosis: NEGATIVE
Neisseria Gonorrhea: NEGATIVE
Trichomonas: NEGATIVE

## 2022-02-03 ENCOUNTER — Other Ambulatory Visit: Payer: Self-pay | Admitting: Family Medicine

## 2022-02-03 ENCOUNTER — Other Ambulatory Visit: Payer: Self-pay

## 2022-02-03 MED ORDER — DEXMETHYLPHENIDATE HCL 2.5 MG PO TABS
2.5000 mg | ORAL_TABLET | Freq: Two times a day (BID) | ORAL | 0 refills | Status: DC
  Filled 2022-02-03 – 2022-02-25 (×3): qty 60, 30d supply, fill #0

## 2022-02-04 ENCOUNTER — Ambulatory Visit: Payer: 59 | Admitting: Family Medicine

## 2022-02-04 ENCOUNTER — Other Ambulatory Visit: Payer: Self-pay

## 2022-02-05 NOTE — Progress Notes (Unsigned)
I,Alesandro Stueve R Reyan Helle,acting as a Education administrator for Gwyneth Sprout, FNP.,have documented all relevant documentation on the behalf of Gwyneth Sprout, FNP,as directed by  Gwyneth Sprout, FNP while in the presence of Gwyneth Sprout, FNP.  Established patient visit   Patient: Kimberly Cameron   DOB: [birth year]   27 y.o. Female  MRN: [id number] Visit Date: 02/08/2022  Today's healthcare provider: Gwyneth Sprout, FNP  Re Introduced to nurse practitioner role and practice setting.  All questions answered.  Discussed provider/patient relationship and expectations.  No chief complaint on file.  Subjective    HPI  Allergy Testing:  Patient complains of sinus pressure and drainage constantly, was advised that it was due to allergies. Patient would like referral for allergy testing to see what she is allergic to  that is causing the drainage   Sore Throat: Patient complains of sore throat. Symptoms began 1 day ago. Pain is of moderate severity. Fever is absent. Other associated symptoms have included  congestion .  Fluid intake is good.  There has not been contact with an individual with known strep.  Current medications include none.    Patient was sick with cold-like symptoms 1 week ago, she presents today with a sore and irritated throat which started 1 day ago. Patient denies fever or chills.    Medications: Outpatient Medications Prior to Visit  Medication Sig   dexmethylphenidate (FOCALIN) 2.5 MG tablet Take 1 tablet (2.5 mg total) by mouth 2 (two) times daily.   sertraline (ZOLOFT) 50 MG tablet Take 1 tablet (50 mg total) by mouth daily.   No facility-administered medications prior to visit.    Review of Systems    Objective    BP 109/74 (BP Location: Left Arm, Patient Position: Sitting, Cuff Size: Large)   Pulse 95   Temp 98.8 F (37.1 C) (Oral)   Wt 200 lb 8 oz (90.9 kg)   SpO2 99%   BMI 30.49 kg/m   Physical Exam Vitals and nursing note reviewed.  Constitutional:       General: She is not in acute distress.    Appearance: Normal appearance. She is obese. She is not ill-appearing, toxic-appearing or diaphoretic.  HENT:     Head: Normocephalic and atraumatic.     Right Ear: Tympanic membrane, ear canal and external ear normal.     Left Ear: Tympanic membrane, ear canal and external ear normal. Tenderness present.     Nose: Nose normal.     Mouth/Throat:     Mouth: Mucous membranes are moist.     Pharynx: Oropharynx is clear. Posterior oropharyngeal erythema present.  Eyes:     Extraocular Movements: Extraocular movements intact.     Pupils: Pupils are equal, round, and reactive to light.  Cardiovascular:     Rate and Rhythm: Normal rate and regular rhythm.     Pulses: Normal pulses.     Heart sounds: Normal heart sounds. No murmur heard.    No friction rub. No gallop.  Pulmonary:     Effort: Pulmonary effort is normal. No respiratory distress.     Breath sounds: Normal breath sounds. No stridor. No wheezing, rhonchi or rales.  Chest:     Chest wall: No tenderness.  Musculoskeletal:        General: No swelling, tenderness, deformity or signs of injury. Normal range of motion.     Right lower leg: No edema.     Left lower leg: No edema.  Skin:    General: Skin is warm and dry.     Capillary Refill: Capillary refill takes less than 2 seconds.     Coloration: Skin is not jaundiced or pale.     Findings: No bruising, erythema, lesion or rash.  Neurological:     General: No focal deficit present.     Mental Status: She is alert and oriented to person, place, and time. Mental status is at baseline.     Cranial Nerves: No cranial nerve deficit.     Sensory: No sensory deficit.     Motor: No weakness.     Coordination: Coordination normal.  Psychiatric:        Mood and Affect: Mood normal.        Behavior: Behavior normal.        Thought Content: Thought content normal.        Judgment: Judgment normal.      Results for orders placed or  performed in visit on 02/08/22  POCT rapid strep A  Result Value Ref Range   Rapid Strep A Screen Negative Negative    Assessment & Plan     Problem List Items Addressed This Visit       Nervous and Auditory   Dysfunction of both eustachian tubes    Chronic, variable Has previously tried intra nasal steroids without improvement Wishes to seek assistance through allergy testing and referrals Also notes hx of sinus cysts, sinus headaches and occasional nose bleeds       Relevant Orders   Ambulatory referral to Allergy   Ambulatory referral to ENT     Other   Bleeding from the nose    Chronic, variable; self limiting Denies drug use Has previously tried intra nasal steroids without improvement Wishes to seek assistance through allergy testing and referrals Also notes hx of sinus cysts, sinus headaches and occasional nose bleeds       Relevant Orders   Ambulatory referral to Allergy   Ambulatory referral to ENT   Sinus headache    Chronic, variable Has previously tried intra nasal steroids without improvement Wishes to seek assistance through allergy testing and referrals Also notes hx of sinus cysts, sinus headaches and occasional nose bleeds       Relevant Orders   Ambulatory referral to Allergy   Ambulatory referral to ENT   Sore throat - Primary    Acute, previously self resolved with use of alka seltzer and day quil Now in setting of recurrent sinus head aches, nasal bleeding and ear fullness Referral to ENT and allergy testing to assist Negative POC testing Hx of tonsil removal as well as sinus cysts       Relevant Orders   POCT rapid strep A (Completed)   Return if symptoms worsen or fail to improve.     Vonna Kotyk, FNP, have reviewed all documentation for this visit. The documentation on 02/08/22 for the exam, diagnosis, procedures, and orders are all accurate and complete.  Gwyneth Sprout, [place] [phone] (phone) [phone] (fax)  [place]

## 2022-02-08 ENCOUNTER — Ambulatory Visit (INDEPENDENT_AMBULATORY_CARE_PROVIDER_SITE_OTHER): Payer: 59 | Admitting: Family Medicine

## 2022-02-08 ENCOUNTER — Encounter: Payer: Self-pay | Admitting: Family Medicine

## 2022-02-08 VITALS — BP 109/74 | HR 95 | Temp 98.8°F | Wt 200.5 lb

## 2022-02-08 DIAGNOSIS — R519 Headache, unspecified: Secondary | ICD-10-CM | POA: Diagnosis not present

## 2022-02-08 DIAGNOSIS — R040 Epistaxis: Secondary | ICD-10-CM | POA: Diagnosis not present

## 2022-02-08 DIAGNOSIS — J029 Acute pharyngitis, unspecified: Secondary | ICD-10-CM

## 2022-02-08 DIAGNOSIS — H6993 Unspecified Eustachian tube disorder, bilateral: Secondary | ICD-10-CM

## 2022-02-08 LAB — POCT RAPID STREP A (OFFICE): Rapid Strep A Screen: NEGATIVE

## 2022-02-08 NOTE — Patient Instructions (Signed)
Referrals placed to both ENT and allergists. Please let us know if you are not contacted in the next 7-10 business days. Please seek emergency assistance if needed.

## 2022-02-08 NOTE — Assessment & Plan Note (Signed)
Chronic, variable Has previously tried intra nasal steroids without improvement Wishes to seek assistance through allergy testing and referrals Also notes hx of sinus cysts, sinus headaches and occasional nose bleeds  

## 2022-02-08 NOTE — Assessment & Plan Note (Signed)
Chronic, variable Has previously tried intra nasal steroids without improvement Wishes to seek assistance through allergy testing and referrals Also notes hx of sinus cysts, sinus headaches and occasional nose bleeds

## 2022-02-08 NOTE — Assessment & Plan Note (Signed)
Acute, previously self resolved with use of alka seltzer and day quil Now in setting of recurrent sinus head aches, nasal bleeding and ear fullness Referral to ENT and allergy testing to assist Negative POC testing Hx of tonsil removal as well as sinus cysts

## 2022-02-08 NOTE — Assessment & Plan Note (Signed)
Chronic, variable; self limiting Denies drug use Has previously tried intra nasal steroids without improvement Wishes to seek assistance through allergy testing and referrals Also notes hx of sinus cysts, sinus headaches and occasional nose bleeds

## 2022-02-14 ENCOUNTER — Other Ambulatory Visit: Payer: Self-pay

## 2022-02-25 ENCOUNTER — Other Ambulatory Visit: Payer: Self-pay

## 2022-02-26 ENCOUNTER — Other Ambulatory Visit: Payer: Self-pay

## 2022-03-01 ENCOUNTER — Other Ambulatory Visit: Payer: Self-pay | Admitting: Family Medicine

## 2022-03-01 NOTE — Telephone Encounter (Signed)
Requested medication (s) are due for refill today- yes  Requested medication (s) are on the active medication list -yes  Future visit scheduled -yes  Last refill: 02/03/22 #60  Notes to clinic: non delegated Rx  Requested Prescriptions  Pending Prescriptions Disp Refills   dexmethylphenidate (FOCALIN) 2.5 MG tablet 60 tablet 0    Sig: Take 1 tablet (2.5 mg total) by mouth 2 (two) times daily.     Not Delegated - Psychiatry:  Stimulants/ADHD Failed - 03/01/2022 11:49 AM      Failed - This refill cannot be delegated      Failed - Urine Drug Screen completed in last 360 days      Passed - Last BP in normal range    BP Readings from Last 1 Encounters:  02/08/22 109/74         Passed - Last Heart Rate in normal range    Pulse Readings from Last 1 Encounters:  02/08/22 95         Passed - Valid encounter within last 6 months    Recent Outpatient Visits           3 weeks ago Sore throat   [place] Gwyneth Sprout, FNP   2 months ago Annual physical exam   [facility] Gwyneth Sprout, FNP   2 months ago [place], [place], PA-C   4 months ago [place] Tally Joe T, FNP   10 months ago Screening for STD (sexually transmitted disease)   [place] Tally Joe T, FNP                 Requested Prescriptions  Pending Prescriptions Disp Refills   dexmethylphenidate (FOCALIN) 2.5 MG tablet 60 tablet 0    Sig: Take 1 tablet (2.5 mg total) by mouth 2 (two) times daily.     Not Delegated - Psychiatry:  Stimulants/ADHD Failed - 03/01/2022 11:49 AM      Failed - This refill cannot be delegated      Failed - Urine Drug Screen completed in last 360 days      Passed - Last BP in normal range    BP Readings from Last 1 Encounters:  02/08/22 109/74         Passed - Last Heart Rate in normal range     Pulse Readings from Last 1 Encounters:  02/08/22 95         Passed - Valid encounter within last 6 months    Recent Outpatient Visits           3 weeks ago Sore throat   [place] Gwyneth Sprout, FNP   2 months ago Annual physical exam   [facility] Gwyneth Sprout, FNP   2 months ago [place], [place], PA-C   4 months ago [place] Tally Joe T, [place]   10 months ago Screening for STD (sexually transmitted disease)   [place] Gwyneth Sprout, FNP

## 2022-03-01 NOTE — Telephone Encounter (Unsigned)
Copied from [place]. Topic: General - Other >> Mar 01, 2022 11:25 AM Everette C wrote: Reason for CRM: Medication Refill - Medication: Rx #: [id number]  dexmethylphenidate (FOCALIN) 2.5 MG tablet [id number]    Has the patient contacted their pharmacy? Yes.   (Agent: If no, request that the patient contact the pharmacy for the refill. If patient does not wish to contact the pharmacy document the reason why and proceed with request.) (Agent: If yes, when and what did the pharmacy advise?)  Preferred Pharmacy (with phone number or street name): [id number] [place] [address] [place] [address]Phone: [phone]: [phone] Not open 24 hours   Has the patient been seen for an appointment in the last year OR does the patient have an upcoming appointment? Yes.    Agent: Please be advised that RX refills may take up to 3 business days. We ask that you follow-up with your pharmacy.

## 2022-03-02 MED ORDER — DEXMETHYLPHENIDATE HCL 2.5 MG PO TABS: 2.5000 mg | ORAL_TABLET | Freq: Two times a day (BID) | ORAL | 0 refills | Status: DC

## 2022-03-22 DIAGNOSIS — J305 Allergic rhinitis due to food: Secondary | ICD-10-CM | POA: Diagnosis not present

## 2022-03-22 DIAGNOSIS — J301 Allergic rhinitis due to pollen: Secondary | ICD-10-CM | POA: Diagnosis not present

## 2022-03-22 DIAGNOSIS — K219 Gastro-esophageal reflux disease without esophagitis: Secondary | ICD-10-CM | POA: Diagnosis not present

## 2022-03-26 ENCOUNTER — Ambulatory Visit: Payer: 59 | Admitting: Family Medicine

## 2022-03-29 ENCOUNTER — Ambulatory Visit (INDEPENDENT_AMBULATORY_CARE_PROVIDER_SITE_OTHER): Payer: 59 | Admitting: Family Medicine

## 2022-03-29 DIAGNOSIS — Z3042 Encounter for surveillance of injectable contraceptive: Secondary | ICD-10-CM | POA: Diagnosis not present

## 2022-03-29 MED ORDER — MEDROXYPROGESTERONE ACETATE 150 MG/ML IM SUSY
150.0000 mg | PREFILLED_SYRINGE | Freq: Once | INTRAMUSCULAR | Status: AC
  Administered 2022-03-29: 150 mg via INTRAMUSCULAR

## 2022-03-29 NOTE — Progress Notes (Signed)
Date last pap: 01/28/22. Last Depo-Provera: 12/31/21. Side Effects if any: n/a. Serum HCG indicated? N/a. Depo-Provera 150 mg IM given by: Rachael Darby, CMA. Next appointment due 6/10-6/24/24.  Patient seen by CMA; tolerated injection well.   Gwyneth Sprout, [place] [address] [place], [place] [address] [phone] (phone) [phone] (fax) [place]

## 2022-04-14 ENCOUNTER — Telehealth: Payer: Self-pay | Admitting: Family Medicine

## 2022-04-14 MED ORDER — SERTRALINE HCL 50 MG PO TABS: 50.0000 mg | ORAL_TABLET | Freq: Every day | ORAL | 1 refills | Status: DC

## 2022-04-14 NOTE — Telephone Encounter (Signed)
Medication Refill - Medication: sertraline (ZOLOFT) 50 MG tablet  Has the patient contacted their pharmacy? No.  Preferred Pharmacy (with phone number or street name):  [id number] [place] [place] - [address] Phone: [phone]  Fax: [phone]   Has the patient been seen for an appointment in the last year OR does the patient have an upcoming appointment? Yes.    Agent: Please be advised that RX refills may take up to 3 business days. We ask that you follow-up with your pharmacy.

## 2022-06-16 ENCOUNTER — Telehealth: Payer: 59 | Admitting: Nurse Practitioner

## 2022-06-16 DIAGNOSIS — J029 Acute pharyngitis, unspecified: Secondary | ICD-10-CM

## 2022-06-16 MED ORDER — AMOXICILLIN 400 MG/5ML PO SUSR: 800.0000 mg | Freq: Two times a day (BID) | ORAL | 0 refills | Status: DC

## 2022-06-16 NOTE — Patient Instructions (Signed)
  Kimberly Cameron, thank you for joining Bennie Pierini, FNP for today's virtual visit.  While this provider is not your primary care provider (PCP), if your PCP is located in our provider database this encounter information will be shared with them immediately following your visit.   A [facility] MyChart account gives you access to today's visit and all your visits, tests, and labs performed at [facility] " click here if you don't have a [facility] MyChart account or go to [email or web address]  Consent: (Patient) Kimberly Cameron provided verbal consent for this virtual visit at the beginning of the encounter.  Current Medications:  Current Outpatient Medications:    amoxicillin (AMOXIL) 400 MG/5ML suspension, Take 10 mLs (800 mg total) by mouth 2 (two) times daily., Disp: 20 mL, Rfl: 0   dexmethylphenidate (FOCALIN) 2.5 MG tablet, Take 1 tablet (2.5 mg total) by mouth 2 (two) times daily., Disp: 60 tablet, Rfl: 0   sertraline (ZOLOFT) 50 MG tablet, Take 1 tablet (50 mg total) by mouth daily., Disp: 90 tablet, Rfl: 1   Medications ordered in this encounter:  Meds ordered this encounter  Medications   amoxicillin (AMOXIL) 400 MG/5ML suspension    Sig: Take 10 mLs (800 mg total) by mouth 2 (two) times daily.    Dispense:  20 mL    Refill:  0    Order Specific Question:   Supervising Provider    Answer:   Merrilee Jansky [[id number]]     *If you need refills on other medications prior to your next appointment, please contact your pharmacy*  Follow-Up: Call back or seek an in-person evaluation if the symptoms worsen or if the condition fails to improve as anticipated.  [facility] [phone]  Other Instructions Take medication as prescribe Cotton underwear Take shower not bath Cranberry juice, yogurt Force fluids AZO over the counter X2 days Culture pending RTO prn    If you have been instructed to have an in-person evaluation  today at a local Urgent Care facility, please use the link below. It will take you to a list of all of our available [facility], including address, phone number and hours of operation. Please do not delay care.  [facility]  If you or a family member do not have a primary care provider, use the link below to schedule a visit and establish care. When you choose a [facility] primary care physician or advanced practice provider, you gain a long-term partner in health. Find a Primary Care Provider  Learn more about [facility] in-office and virtual care options: [facility] - Get Care Now

## 2022-06-16 NOTE — Progress Notes (Signed)
Virtual Visit Consent   Kimberly Cameron, you are scheduled for a virtual visit with Mary-Margaret Daphine Deutscher, FNP, a [facility] provider, today.     Just as with appointments in the office, your consent must be obtained to participate.  Your consent will be active for this visit and any virtual visit you may have with one of our providers in the next 365 days.     If you have a MyChart account, a copy of this consent can be sent to you electronically.  All virtual visits are billed to your insurance company just like a traditional visit in the office.    As this is a virtual visit, video technology does not allow for your provider to perform a traditional examination.  This may limit your provider's ability to fully assess your condition.  If your provider identifies any concerns that need to be evaluated in person or the need to arrange testing (such as labs, EKG, etc.), we will make arrangements to do so.     Although advances in technology are sophisticated, we cannot ensure that it will always work on either your end or our end.  If the connection with a video visit is poor, the visit may have to be switched to a telephone visit.  With either a video or telephone visit, we are not always able to ensure that we have a secure connection.     I need to obtain your verbal consent now.   Are you willing to proceed with your visit today? YES   Kimberly Cameron has provided verbal consent on 06/16/2022 for a virtual visit (video or telephone).   Mary-Margaret Daphine Deutscher, FNP   Date: 06/16/2022 11:01 AM   Virtual Visit via Video Note   I, Mary-Margaret Daphine Deutscher, connected with Kimberly Cameron ([id number], 03/26/1995) on 06/16/22 at 11:00 AM EDT by a video-enabled telemedicine application and verified that I am speaking with the correct person using two identifiers.  Location: Patient: Virtual Visit Location Patient: Home Provider: Virtual Visit Location Provider: Mobile   I discussed the  limitations of evaluation and management by telemedicine and the availability of in person appointments. The patient expressed understanding and agreed to proceed.    History of Present Illness: Kimberly Cameron is a 27 y.o. who identifies as a female who was assigned female at birth, and is being seen today for sore throat.  HPI: Sore Throat  This is a new problem. The current episode started in the past 7 days. The maximum temperature recorded prior to her arrival was 100.4 - 100.9 F. The pain is at a severity of 5/10. Associated symptoms include congestion, headaches, a hoarse voice and trouble swallowing. She has had no exposure to strep. 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 She has tried acetaminophen (alkseltzer, theraflu) for the symptoms. The treatment provided mild relief.    Review of Systems  HENT:  Positive for congestion, hoarse voice and trouble swallowing.   Neurological:  Positive for headaches.    Problems:  Patient Active Problem List   Diagnosis Date Noted   Dysfunction of both eustachian tubes 02/08/2022   Bleeding from the nose 02/08/2022   Sinus headache 02/08/2022   Sore throat 02/08/2022   Surveillance for medroxyprogesterone contraception 12/31/2021   Depression, recurrent (HCC) 12/31/2021    Allergies: No Known Allergies Medications:  Current Outpatient Medications:    dexmethylphenidate (FOCALIN) 2.5 MG tablet, Take 1 tablet (2.5 mg total) by mouth 2 (two) times daily., Disp: 60 tablet,  Rfl: 0   sertraline (ZOLOFT) 50 MG tablet, Take 1 tablet (50 mg total) by mouth daily., Disp: 90 tablet, Rfl: 1  Observations/Objective: Patient is well-developed, well-nourished in no acute distress.  Resting comfortably  at home.  Head is normocephalic, atraumatic.  No labored breathing.  Speech is clear and coherent with logical content.  Patient is alert and oriented at baseline.  Hoarse voice Swollen glands bil neck Dry cough Erythematous with white  patches  Assessment and Plan:  Kimberly Cameron in today with chief complaint of No chief complaint on file.   1. Pharyngitis, unspecified etiology Force fluids Motrin or tylenol OTC OTC decongestant Throat lozenges if help New toothbrush in 3 days  Meds ordered this encounter  Medications   amoxicillin (AMOXIL) 400 MG/5ML suspension    Sig: Take 10 mLs (800 mg total) by mouth 2 (two) times daily.    Dispense:  20 mL    Refill:  0    Order Specific Question:   Supervising Provider    Answer:   Merrilee Jansky [[id number]]     Follow Up Instructions: I discussed the assessment and treatment plan with the patient. The patient was provided an opportunity to ask questions and all were answered. The patient agreed with the plan and demonstrated an understanding of the instructions.  A copy of instructions were sent to the patient via MyChart.  The patient was advised to call back or seek an in-person evaluation if the symptoms worsen or if the condition fails to improve as anticipated.  Time:  I spent 5 minutes with the patient via telehealth technology discussing the above problems/concerns.    Mary-Margaret Daphine Deutscher, FNP

## 2022-06-18 DIAGNOSIS — J029 Acute pharyngitis, unspecified: Secondary | ICD-10-CM | POA: Diagnosis not present

## 2022-06-18 DIAGNOSIS — J209 Acute bronchitis, unspecified: Secondary | ICD-10-CM | POA: Diagnosis not present

## 2022-06-18 DIAGNOSIS — R059 Cough, unspecified: Secondary | ICD-10-CM | POA: Diagnosis not present

## 2022-06-23 ENCOUNTER — Ambulatory Visit: Payer: 59 | Admitting: Family Medicine

## 2022-06-23 DIAGNOSIS — Z3042 Encounter for surveillance of injectable contraceptive: Secondary | ICD-10-CM | POA: Diagnosis not present

## 2022-06-23 MED ORDER — MEDROXYPROGESTERONE ACETATE 150 MG/ML IM SUSY
150.0000 mg | PREFILLED_SYRINGE | Freq: Once | INTRAMUSCULAR | Status: AC
  Administered 2022-06-23: 150 mg via INTRAMUSCULAR

## 2022-06-23 NOTE — Progress Notes (Signed)
    I,Sha'taria Tyson,acting as a Neurosurgeon for [name], FNP.,have documented all relevant documentation on the behalf of [name], FNP,as directed by  [name], FNP while in the presence of [name], FNP.   Established patient visit   Patient: Kimberly Cameron   DOB: November 22, 1995   26 y.o. Female  MRN: [id number] Visit Date: 06/23/2022  Today's healthcare provider: [name], FNP   Request DEPO and cough; however, does not have time to see PCP for cough d/t accident on way in and need to get to work.   Subjective    HPI  Administered patient depo injection. Advised next injection due 09/04-09/18  Medications: Outpatient Medications Prior to Visit  Medication Sig   amoxicillin (AMOXIL) 400 MG/5ML suspension Take 10 mLs (800 mg total) by mouth 2 (two) times daily.   dexmethylphenidate (FOCALIN) 2.5 MG tablet Take 1 tablet (2.5 mg total) by mouth 2 (two) times daily.   sertraline (ZOLOFT) 50 MG tablet Take 1 tablet (50 mg total) by mouth daily.   No facility-administered medications prior to visit.   Review of Systems     Objective    There were no vitals taken for this visit.   Physical Exam   No PE was completed; pt seen by CMA alone d/t time restraints No results found for any visits on 06/23/22.  Assessment & Plan     Problem List Items Addressed This Visit       Other   Surveillance for medroxyprogesterone contraception - Primary   Continue to seek return visit for cough if not improved by supportive care measures.   Some things that can make you feel better are: - Increased rest - Increasing Fluids - Acetaminophen / ibuprofen as needed for fever/pain.  - Salt water gargling, chloraseptic spray and throat lozenges - OTC pseudoephedrine.  - Mucinex.  - Saline sinus flushes or a neti pot.  - Humidifying the air.    Leilani Merl, FNP, have reviewed all documentation for this visit. The documentation on 06/23/22 for the exam,  diagnosis, procedures, and orders are all accurate and complete.  [name], FNP  [facility] [phone] (phone) [phone] (fax)  [facility]

## 2022-06-24 ENCOUNTER — Encounter: Payer: Self-pay | Admitting: Family Medicine

## 2022-06-24 ENCOUNTER — Ambulatory Visit (INDEPENDENT_AMBULATORY_CARE_PROVIDER_SITE_OTHER): Payer: 59 | Admitting: Family Medicine

## 2022-06-24 VITALS — BP 116/71 | HR 74 | Wt 202.1 lb

## 2022-06-24 DIAGNOSIS — J40 Bronchitis, not specified as acute or chronic: Secondary | ICD-10-CM | POA: Insufficient documentation

## 2022-06-24 DIAGNOSIS — J3089 Other allergic rhinitis: Secondary | ICD-10-CM | POA: Diagnosis not present

## 2022-06-24 MED ORDER — BENZONATATE 200 MG PO CAPS
200.0000 mg | ORAL_CAPSULE | Freq: Two times a day (BID) | ORAL | 0 refills | Status: DC | PRN
Start: 2022-06-24 — End: 2022-09-10

## 2022-06-24 MED ORDER — METHYLPREDNISOLONE 4 MG PO TBPK: ORAL_TABLET | ORAL | 0 refills | Status: DC

## 2022-06-24 MED ORDER — ALBUTEROL SULFATE HFA 108 (90 BASE) MCG/ACT IN AERS: 2.0000 | INHALATION_SPRAY | Freq: Four times a day (QID) | RESPIRATORY_TRACT | 2 refills | Status: DC | PRN

## 2022-06-24 MED ORDER — LEVOCETIRIZINE DIHYDROCHLORIDE 5 MG PO TABS
5.0000 mg | ORAL_TABLET | Freq: Every evening | ORAL | 3 refills | Status: DC
Start: 2022-06-24 — End: 2022-09-10

## 2022-06-24 MED ORDER — HYDROCOD POLI-CHLORPHE POLI ER 10-8 MG/5ML PO SUER
5.0000 mL | Freq: Every evening | ORAL | 0 refills | Status: DC | PRN
Start: 2022-06-24 — End: 2022-09-10

## 2022-06-24 NOTE — Assessment & Plan Note (Signed)
Acute, recurrent Recommend oral steroids given inflammation and irritation and non productive cough Start steroid burst and inhaler

## 2022-06-24 NOTE — Assessment & Plan Note (Addendum)
Acute on chronic with recent exacerbation Encourage DAILY use of anti-histamine Welcome to choose medication of her choice Previously recommendations were for zyrtec/generic or claritin/generic Will now try xyzal to assist given ongoing complaints Had allergy testing appt; however, was sick and unable to attned Now rescheduled for 3 wks from now in mid-July

## 2022-06-24 NOTE — Progress Notes (Signed)
I,Sha'taria Tyson,acting as a Neurosurgeon for Kimberly Kindle, FNP.,have documented all relevant documentation on the behalf of Kimberly Kindle, FNP,as directed by  Kimberly Kindle, FNP while in the presence of Kimberly Kindle, FNP.   Established patient visit  Patient: Kimberly Cameron   DOB: 1995-04-23   27 y.o. Female  MRN: [id number] Visit Date: 06/24/2022  Today's healthcare provider: [name], FNP  Re Introduced to nurse practitioner role and practice setting.  All questions answered.  Discussed provider/patient relationship and expectations.  Subjective    Cough This is a new (06/09) problem. The current episode started 1 to 4 weeks ago. The problem has been gradually worsening. The problem occurs every few minutes. The cough is Non-productive. Associated symptoms include a fever, nasal congestion, postnasal drip and a sore throat. The symptoms are aggravated by lying down. Her past medical history is significant for bronchitis and environmental allergies.    Medications: Outpatient Medications Prior to Visit  Medication Sig   dexmethylphenidate (FOCALIN) 2.5 MG tablet Take 1 tablet (2.5 mg total) by mouth 2 (two) times daily.   sertraline (ZOLOFT) 50 MG tablet Take 1 tablet (50 mg total) by mouth daily.   [DISCONTINUED] amoxicillin (AMOXIL) 400 MG/5ML suspension Take 10 mLs (800 mg total) by mouth 2 (two) times daily. (Patient not taking: Reported on 06/24/2022)   [DISCONTINUED] lidocaine (XYLOCAINE) 2 % solution Take by mouth. (Patient not taking: Reported on 06/24/2022)   No facility-administered medications prior to visit.   Review of Systems  Constitutional:  Positive for fever.  HENT:  Positive for postnasal drip and sore throat.   Respiratory:  Positive for cough.   Allergic/Immunologic: Positive for environmental allergies.    Reports fever <100F >1 week ago. Lives in a camper with poor AC use. Works as a Conservation officer, historic buildings    Objective    BP 116/71 (BP Location: Right  Arm, Patient Position: Sitting, Cuff Size: Large)   Pulse 74   Wt 202 lb 1.6 oz (91.7 kg)   SpO2 99%   BMI 30.73 kg/m   Physical Exam Vitals and nursing note reviewed.  Constitutional:      General: She is not in acute distress.    Appearance: Normal appearance. She is obese. She is not ill-appearing, toxic-appearing or diaphoretic.  HENT:     Head: Normocephalic and atraumatic.     Right Ear: Swelling and tenderness present.     Left Ear: Swelling and tenderness present.     Nose: Congestion present.     Right Turbinates: Enlarged, swollen and pale.     Left Turbinates: Enlarged, swollen and pale.     Right Sinus: No maxillary sinus tenderness or frontal sinus tenderness.     Left Sinus: No maxillary sinus tenderness or frontal sinus tenderness.  Cardiovascular:     Rate and Rhythm: Normal rate and regular rhythm.     Pulses: Normal pulses.     Heart sounds: Normal heart sounds. No murmur heard.    No friction rub. No gallop.  Pulmonary:     Effort: Pulmonary effort is normal. No respiratory distress.     Breath sounds: Normal breath sounds. Decreased air movement present. No stridor. No wheezing, rhonchi or rales.     Comments: Bronchial irritation and slight pseudo-wheeze  Chest:     Chest wall: No tenderness.  Abdominal:     General: Bowel sounds are normal.     Palpations: Abdomen is soft.  Musculoskeletal:  General: No swelling, tenderness, deformity or signs of injury. Normal range of motion.     Right lower leg: No edema.     Left lower leg: No edema.  Skin:    General: Skin is warm and dry.     Capillary Refill: Capillary refill takes less than 2 seconds.     Coloration: Skin is not jaundiced or pale.     Findings: No bruising, erythema, lesion or rash.  Neurological:     General: No focal deficit present.     Mental Status: She is alert and oriented to person, place, and time. Mental status is at baseline.     Cranial Nerves: No cranial nerve deficit.      Sensory: No sensory deficit.     Motor: No weakness.     Coordination: Coordination normal.  Psychiatric:        Mood and Affect: Mood normal.        Behavior: Behavior normal.        Thought Content: Thought content normal.        Judgment: Judgment normal.     No results found for any visits on 06/24/22.  Assessment & Plan     Problem List Items Addressed This Visit       Respiratory   Bronchitis - Primary    Acute, recurrent Recommend oral steroids given inflammation and irritation and non productive cough Start steroid burst and inhaler       Relevant Medications   albuterol (VENTOLIN HFA) 108 (90 Base) MCG/ACT inhaler   methylPREDNISolone (MEDROL DOSEPAK) 4 MG TBPK tablet   chlorpheniramine-HYDROcodone (TUSSIONEX) 10-8 MG/5ML   levocetirizine (XYZAL) 5 MG tablet   benzonatate (TESSALON) 200 MG capsule   Non-seasonal allergic rhinitis    Acute on chronic with recent exacerbation Encourage DAILY use of anti-histamine Welcome to choose medication of her choice Previously recommendations were for zyrtec/generic or claritin/generic Will now try xyzal to assist given ongoing complaints Had allergy testing appt; however, was sick and unable to attned Now rescheduled for 3 wks from now in mid-July       Relevant Medications   albuterol (VENTOLIN HFA) 108 (90 Base) MCG/ACT inhaler   methylPREDNISolone (MEDROL DOSEPAK) 4 MG TBPK tablet   chlorpheniramine-HYDROcodone (TUSSIONEX) 10-8 MG/5ML   levocetirizine (XYZAL) 5 MG tablet   benzonatate (TESSALON) 200 MG capsule   Return if symptoms worsen or fail to improve.     Leilani Merl, FNP, have reviewed all documentation for this visit. The documentation on 06/24/22 for the exam, diagnosis, procedures, and orders are all accurate and complete.  Kimberly Kindle, FNP  [facility] [phone] (phone) [phone] (fax)  [facility]

## 2022-06-30 ENCOUNTER — Ambulatory Visit: Payer: Self-pay

## 2022-06-30 NOTE — Telephone Encounter (Signed)
Message from Randol Kern sent at 06/30/2022 11:43 AM EDT  Summary: Dizziness   Pt has questions about her medications, says ever since Sunday night (after being outside most of the day) says she is not sure if her inhaler is causing this. She feels dizzy, weak, fatigued. Also has missed steroids.  Best contact: [phone]         Chief Complaint: dizziness Symptoms: nausea, fatigue and weak  Frequency: Sunday  Pertinent Negatives: Patient denies bleeding, fever, chest pain vomiting Disposition: [] ED /[x] Urgent Care (no appt availability in office) / [] Appointment(In office/virtual)/ []  [place] [facility]/ [] Home Care/ [] Refused Recommended Disposition /[] [place] [facility]/ []  Follow-up with PCP Additional Notes: pt refused Friday appt. No available appt's today. Staff at lunch. Just in case, can pt be fit in? Pt wanting to be seen in person only. Advised UC. Unsure if she will go.  Pt stated she was prescribed 5 medications and took Prednisone for 1 day then stopped. Pt has not restarted any meds. Reason for Disposition  [1] MILD dizziness (e.g., walking normally) AND [2] has NOT been evaluated by doctor (or NP/PA) for this  (Exception: Dizziness caused by heat exposure, sudden standing, or poor fluid intake.)  Answer Assessment - Initial Assessment Questions 1. DESCRIPTION: "Describe your dizziness."     Felt sick and  2. LIGHTHEADED: "Do you feel lightheaded?" (e.g., somewhat faint, woozy, weak upon standing)   Tired and feels dehydrated drinking lots of water  4. SEVERITY: "How bad is it?"  "Do you feel like you are going to faint?" "Can you stand and walk?"   - MILD: Feels slightly dizzy, but walking normally.   - MODERATE: Feels unsteady when walking, but not falling; interferes with normal activities (e.g., school, work).   - SEVERE: Unable to walk without falling, or requires assistance to walk without falling; feels like passing out now.      Mild  5. ONSET:   "When did the dizziness begin?"     Sunday face would get hot nauseated and waves kept coming back  6. AGGRAVATING FACTORS: "Does anything make it worse?" (e.g., standing, change in head position)     standing 7. HEART RATE: "Can you tell me your heart rate?" "How many beats in 15 seconds?"  (Note: not all patients can do this)        8. CAUSE: "What do you think is causing the dizziness?"     ? Missed steroids   10. OTHER SYMPTOMS: "Do you have any other symptoms?" (e.g., fever, chest pain, vomiting, diarrhea, bleeding)       Nausea , overheated bloated after work  11. PREGNANCY: "Is there any chance you are pregnant?" "When was your last menstrual period?"       Depo shot  Protocols used: Dizziness - Lightheadedness-A-AH

## 2022-07-02 ENCOUNTER — Ambulatory Visit: Payer: 59 | Admitting: Physician Assistant

## 2022-07-02 ENCOUNTER — Ambulatory Visit: Payer: Self-pay

## 2022-07-02 ENCOUNTER — Encounter: Payer: Self-pay | Admitting: Physician Assistant

## 2022-07-02 VITALS — BP 107/72 | HR 80 | Temp 99.4°F | Wt 200.8 lb

## 2022-07-02 DIAGNOSIS — H6593 Unspecified nonsuppurative otitis media, bilateral: Secondary | ICD-10-CM

## 2022-07-02 DIAGNOSIS — R42 Dizziness and giddiness: Secondary | ICD-10-CM

## 2022-07-02 DIAGNOSIS — R051 Acute cough: Secondary | ICD-10-CM | POA: Diagnosis not present

## 2022-07-02 NOTE — Telephone Encounter (Signed)
  Chief Complaint: Infection on leg - spider bite?, dizziness, chest tightness Symptoms: above Frequency: Ongoing since URI Pertinent Negatives: Patient denies  Disposition: [] ED /[] Urgent Care (no appt availability in office) / [x] Appointment(In office/virtual)/ []  [place] [facility]/ [] Home Care/ [] Refused Recommended Disposition /[] [place] [facility]/ []  Follow-up with PCP Additional Notes: Pt has recently had a URI and still is not recovered. Pt has discovered a red are on her leg at panty line that is red an infected. Pt thinks this may be a spider bite as s/s are similar to a spider bite she had before. No appts at [facility]. Pt will see Erin [facility]um at [facility] this afternoon.    Reason for Disposition  [1] Looks infected (spreading redness, red streak) AND [2] no fever  Answer Assessment - Initial Assessment Questions 1. LOCATION: "Where is the wound located?"      Panty line on leg 2. WOUND APPEARANCE: "What does the wound look like?"      Infected 3. SIZE: If redness is present, ask: "What is the size of the red area?" (Inches, centimeters, or compare to size of a coin)      unsure 4. SPREAD: "What's changed in the last day?"  "Do you see any red streaks coming from the wound?"     Sunday 5. ONSET: "When did it start to look infected?"      unsure 6. MECHANISM: "How did the wound start, what was the cause?"     Possible spider bite 7. PAIN: Do you have any pain?"  If Yes, ask: "How bad is the pain?"  (e.g., Scale 1-10; mild, moderate, or severe)    - MILD (1-3): Doesn't interfere with normal activities.     - MODERATE (4-7): Interferes with normal activities or awakens from sleep.    - SEVERE (8-10): Excruciating pain, unable to do any normal activities.       moderate  9. OTHER SYMPTOMS: "Do you have any other symptoms?" (e.g., shaking chills, weakness, rash elsewhere on body)     Dizziness, shaking  Protocols used: Wound Infection Suspected-A-AH

## 2022-07-02 NOTE — Patient Instructions (Addendum)
I recommend the following to help with your coughing  Please finish the Medrol dose pack that you got during your apt with Jed Limerick can also take the Benzonatate for the cough  I also recommend adding an antihistamine to your daily regimen This includes medications like Claritin, Allegra, Zyrtec- the generics of these work very well and are usually less expensive I recommend using Flonase nasal spray - 2 puffs twice per day to help with your nasal congestion The antihistamines and Flonase can take a few weeks to provide significant relief from allergy symptoms but should start to provide some benefit soon.  I recommend using Tylenol and Ibuprofen for pain from the bite on your leg   Please make sure you are staying well hydrated, especially when you are outside for prolonged periods  We will keep you updated on your labs once they return

## 2022-07-02 NOTE — Progress Notes (Signed)
Acute Office Visit   Patient: Kimberly Cameron   DOB: 08/24/1995   27 y.o. Female  MRN: [id number] Visit Date: 07/02/2022  Today's healthcare provider: Oswaldo Conroy Leilan Bochenek, PA-C  Introduced myself to the patient as a Secondary school teacher and provided education on APPs in clinical practice.    Chief Complaint  Patient presents with   Dizziness    Patient says she was sick in the past for about two weeks. Patient says she got better and then she noticed she did not feel well. Patient says she noticed it after working a shift at work. Patient says she is not sure if it is from the insect bite on her L bikini area. Patient was prescribed 5 different prescription that she never completed any of the courses of treatment within the past two weeks.    Subjective    HPI HPI     Dizziness    Additional comments: Patient says she was sick in the past for about two weeks. Patient says she got better and then she noticed she did not feel well. Patient says she noticed it after working a shift at work. Patient says she is not sure if it is from the insect bite on her L bikini area. Patient was prescribed 5 different prescription that she never completed any of the courses of treatment within the past two weeks.       Last edited by Malen Gauze, CMA on 07/02/2022  2:08 PM.      She reports she had a cold about 2 weeks ago with congestion, sore throat, coughing She states now she has cough that sometimes causes her to gag She reports recently she was outside for a while at work and started to feel flushed, dizzy and tired She reports feeling tired and fatigued this past week  She reports that she has a small bump that she thinks is a spider bite  States she noticed it Tuesday but it started hurting yesterday   She reports persistent dizziness today  She reports she used the inhaler once and became extremely dizzy and then used it on Sunday and felt very sick again   Medications: Outpatient  Medications Prior to Visit  Medication Sig   dexmethylphenidate (FOCALIN) 2.5 MG tablet Take 1 tablet (2.5 mg total) by mouth 2 (two) times daily.   sertraline (ZOLOFT) 50 MG tablet Take 1 tablet (50 mg total) by mouth daily.   albuterol (VENTOLIN HFA) 108 (90 Base) MCG/ACT inhaler Inhale 2 puffs into the lungs every 6 (six) hours as needed for wheezing or shortness of breath. (Patient not taking: Reported on 07/02/2022)   benzonatate (TESSALON) 200 MG capsule Take 1 capsule (200 mg total) by mouth 2 (two) times daily as needed for cough. (Patient not taking: Reported on 07/02/2022)   chlorpheniramine-HYDROcodone (TUSSIONEX) 10-8 MG/5ML Take 5 mLs by mouth at bedtime as needed for cough. (Patient not taking: Reported on 07/02/2022)   levocetirizine (XYZAL) 5 MG tablet Take 1 tablet (5 mg total) by mouth every evening. (Patient not taking: Reported on 07/02/2022)   methylPREDNISolone (MEDROL DOSEPAK) 4 MG TBPK tablet Take as directed on package; with food. (Patient not taking: Reported on 07/02/2022)   No facility-administered medications prior to visit.    Review of Systems  Constitutional:  Positive for appetite change and fatigue. Negative for chills and fever.  Respiratory:  Positive for cough. Negative for shortness of breath and wheezing.   Musculoskeletal:  Negative for arthralgias and myalgias.  Neurological:  Positive for dizziness and light-headedness.       Objective    BP 107/72   Pulse 80   Temp 99.4 F (37.4 C)   Wt 200 lb 12.8 oz (91.1 kg)   SpO2 98%   BMI 30.53 kg/m    Physical Exam Vitals reviewed.  Constitutional:      Appearance: Normal appearance.  HENT:     Head: Normocephalic and atraumatic.     Right Ear: Hearing and ear canal normal. A middle ear effusion is present.     Left Ear: Hearing and ear canal normal. A middle ear effusion is present.     Mouth/Throat:     Lips: Pink.     Mouth: Mucous membranes are moist.     Pharynx: Oropharynx is clear. Uvula  midline. Posterior oropharyngeal erythema present. No oropharyngeal exudate or uvula swelling.  Cardiovascular:     Rate and Rhythm: Normal rate and regular rhythm.     Pulses: Normal pulses.          Radial pulses are 2+ on the right side and 2+ on the left side.     Heart sounds: Normal heart sounds. No murmur heard.    No friction rub. No gallop.  Pulmonary:     Effort: Pulmonary effort is normal.     Breath sounds: Normal breath sounds. No decreased air movement. No decreased breath sounds, wheezing, rhonchi or rales.  Musculoskeletal:     Cervical back: Normal range of motion and neck supple.     Right lower leg: No edema.     Left lower leg: No edema.  Lymphadenopathy:     Head:     Right side of head: No submental, submandibular or preauricular adenopathy.     Left side of head: No submental, submandibular or preauricular adenopathy.     Cervical:     Right cervical: No superficial cervical adenopathy.    Left cervical: No superficial cervical adenopathy.     Upper Body:     Right upper body: No supraclavicular adenopathy.     Left upper body: No supraclavicular adenopathy.  Skin:    General: Skin is warm and dry.       Neurological:     Mental Status: She is alert.       Results for orders placed or performed in visit on 07/02/22  CBC w/Diff  Result Value Ref Range   WBC 6.9 3.4 - 10.8 x10E3/uL   RBC 4.49 3.77 - 5.28 x10E6/uL   Hemoglobin 13.2 11.1 - 15.9 g/dL   Hematocrit 16.1 09.6 - 46.6 %   MCV 90 79 - 97 fL   MCH 29.4 26.6 - 33.0 pg   MCHC 32.7 31.5 - 35.7 g/dL   RDW 04.5 40.9 - 81.1 %   Platelets 360 150 - 450 x10E3/uL   Neutrophils 54 Not Estab. %   Lymphs 34 Not Estab. %   Monocytes 9 Not Estab. %   Eos 2 Not Estab. %   Basos 1 Not Estab. %   Neutrophils Absolute 3.8 1.4 - 7.0 x10E3/uL   Lymphocytes Absolute 2.3 0.7 - 3.1 x10E3/uL   Monocytes Absolute 0.6 0.1 - 0.9 x10E3/uL   EOS (ABSOLUTE) 0.1 0.0 - 0.4 x10E3/uL   Basophils Absolute 0.0 0.0 - 0.2  x10E3/uL   Immature Granulocytes 0 Not Estab. %   Immature Grans (Abs) 0.0 0.0 - 0.1 x10E3/uL  Comp Met (CMET)  Result Value Ref  Range   Glucose 88 70 - 99 mg/dL   BUN 9 6 - 20 mg/dL   Creatinine, Ser 1.61 0.57 - 1.00 mg/dL   eGFR 096 >04 VW/UJW/1.19   BUN/Creatinine Ratio 12 9 - 23   Sodium 141 134 - 144 mmol/L   Potassium 3.7 3.5 - 5.2 mmol/L   Chloride 107 (H) 96 - 106 mmol/L   CO2 22 20 - 29 mmol/L   Calcium 9.0 8.7 - 10.2 mg/dL   Total Protein 6.5 6.0 - 8.5 g/dL   Albumin 4.2 4.0 - 5.0 g/dL   Globulin, Total 2.3 1.5 - 4.5 g/dL   Bilirubin Total 0.2 0.0 - 1.2 mg/dL   Alkaline Phosphatase 90 44 - 121 IU/L   AST 18 0 - 40 IU/L   ALT 17 0 - 32 IU/L    Assessment & Plan      No follow-ups on file.       Problem List Items Addressed This Visit   None Visit Diagnoses     Middle ear effusion, bilateral    -  Primary Acute, ongoing condition per chart review Recommend she restarts her antihistamine and add Flonase to assist with sinus congestion and potential Eustachian tube dysfunction  Discussed that this can take several weeks to reach full therapeutic effects  We discussed that allergic rhinitis may also be causing  her current cough and these measures would be beneficial for that as well Follow up as needed for persistent or progressing symptoms      Dizziness     Acute, new concern We discussed various potential etiologies for dizziness- dehydration, eustachian tube dysfunction, anemia Will check CBC for anemia and CMP for dehydration  Recommend she use antihistamine and flonase to assist with inner ear issues Reviewed staying well hydrated and eating regularly especially when she has been outside exerting herself Follow up as needed for persistent or progressing symptoms     Relevant Orders   CBC w/Diff (Completed)   Comp Met (CMET) (Completed)   Acute cough     Acute, ongoing issue She reports persistent coughing spells Recommend she finishes her Medrol  dose pack to reduce inflammation and potential bronchospasm She can also use her Tessalon pearls to further assist with coughing Follow up as needed for persistent or progressing symptoms          No follow-ups on file.   I, Sondi Desch E Alexandre Lightsey, PA-C, have reviewed all documentation for this visit. The documentation on 07/05/22 for the exam, diagnosis, procedures, and orders are all accurate and complete.   Jacquelin Hawking, MHS, PA-C [facility]

## 2022-07-03 LAB — CBC WITH DIFFERENTIAL/PLATELET
Basophils Absolute: 0 10*3/uL (ref 0.0–0.2)
Basos: 1 %
EOS (ABSOLUTE): 0.1 10*3/uL (ref 0.0–0.4)
Eos: 2 %
Hematocrit: 40.4 % (ref 34.0–46.6)
Hemoglobin: 13.2 g/dL (ref 11.1–15.9)
Immature Grans (Abs): 0 10*3/uL (ref 0.0–0.1)
Immature Granulocytes: 0 %
Lymphocytes Absolute: 2.3 10*3/uL (ref 0.7–3.1)
Lymphs: 34 %
MCH: 29.4 pg (ref 26.6–33.0)
MCHC: 32.7 g/dL (ref 31.5–35.7)
MCV: 90 fL (ref 79–97)
Monocytes Absolute: 0.6 10*3/uL (ref 0.1–0.9)
Monocytes: 9 %
Neutrophils Absolute: 3.8 10*3/uL (ref 1.4–7.0)
Neutrophils: 54 %
Platelets: 360 10*3/uL (ref 150–450)
RBC: 4.49 x10E6/uL (ref 3.77–5.28)
RDW: 12.2 % (ref 11.7–15.4)
WBC: 6.9 10*3/uL (ref 3.4–10.8)

## 2022-07-03 LAB — COMPREHENSIVE METABOLIC PANEL
ALT: 17 IU/L (ref 0–32)
AST: 18 IU/L (ref 0–40)
Albumin: 4.2 g/dL (ref 4.0–5.0)
Alkaline Phosphatase: 90 IU/L (ref 44–121)
BUN/Creatinine Ratio: 12 (ref 9–23)
BUN: 9 mg/dL (ref 6–20)
Bilirubin Total: 0.2 mg/dL (ref 0.0–1.2)
CO2: 22 mmol/L (ref 20–29)
Calcium: 9 mg/dL (ref 8.7–10.2)
Chloride: 107 mmol/L — ABNORMAL HIGH (ref 96–106)
Creatinine, Ser: 0.77 mg/dL (ref 0.57–1.00)
Globulin, Total: 2.3 g/dL (ref 1.5–4.5)
Glucose: 88 mg/dL (ref 70–99)
Potassium: 3.7 mmol/L (ref 3.5–5.2)
Sodium: 141 mmol/L (ref 134–144)
Total Protein: 6.5 g/dL (ref 6.0–8.5)
eGFR: 108 mL/min/{1.73_m2} (ref >59)

## 2022-07-05 NOTE — Progress Notes (Signed)
Your labs were normal and stable. No signs of anemia or dehydration.

## 2022-07-13 DIAGNOSIS — J301 Allergic rhinitis due to pollen: Secondary | ICD-10-CM | POA: Diagnosis not present

## 2022-09-10 ENCOUNTER — Encounter: Payer: Self-pay | Admitting: Family Medicine

## 2022-09-10 ENCOUNTER — Ambulatory Visit: Payer: 59 | Admitting: Family Medicine

## 2022-09-10 VITALS — BP 122/74 | HR 88 | Temp 98.4°F | Ht 68.0 in | Wt 207.3 lb

## 2022-09-10 DIAGNOSIS — J3089 Other allergic rhinitis: Secondary | ICD-10-CM | POA: Diagnosis not present

## 2022-09-10 DIAGNOSIS — J029 Acute pharyngitis, unspecified: Secondary | ICD-10-CM

## 2022-09-10 DIAGNOSIS — Z3042 Encounter for surveillance of injectable contraceptive: Secondary | ICD-10-CM

## 2022-09-10 DIAGNOSIS — J40 Bronchitis, not specified as acute or chronic: Secondary | ICD-10-CM | POA: Diagnosis not present

## 2022-09-10 DIAGNOSIS — Z1152 Encounter for screening for COVID-19: Secondary | ICD-10-CM | POA: Diagnosis not present

## 2022-09-10 LAB — POC COVID19 BINAXNOW: SARS Coronavirus 2 Ag: NEGATIVE

## 2022-09-10 MED ORDER — MEDROXYPROGESTERONE ACETATE 150 MG/ML IM SUSP: 150.0000 mg | INTRAMUSCULAR | 0 refills | Status: DC

## 2022-09-10 MED ORDER — LEVOCETIRIZINE DIHYDROCHLORIDE 5 MG PO TABS: 5.0000 mg | ORAL_TABLET | Freq: Every evening | ORAL | 3 refills | Status: DC

## 2022-09-10 NOTE — Assessment & Plan Note (Signed)
Chronic, with complaints of PND Request for POC COVID; no s/s of strep POC COVID negative Continue supportive care- If you start feeling worse with facial pain, high fever, cough, shortness of breath or start feeling significantly worse, please call us right away to be further evaluated.  Some things that can make you feel better are: - Increased rest - Increasing Fluids - Acetaminophen / ibuprofen as needed for fever/pain.  - Salt water gargling, chloraseptic spray and throat lozenges - OTC pseudoephedrine.  - Mucinex.  - Saline sinus flushes or a neti pot.  - Humidifying the air.

## 2022-09-10 NOTE — Assessment & Plan Note (Signed)
No concerns; Rx written for medication. Due for admin prior to 9/19

## 2022-09-10 NOTE — Progress Notes (Signed)
Established patient visit   Patient: Kimberly Cameron   DOB: 17-Apr-1995   27 y.o. Female  MRN: [id number] Visit Date: 09/10/2022  Today's healthcare provider: [name], FNP  Introduced to nurse practitioner role and practice setting.  All questions answered.  Discussed provider/patient relationship and expectations.  Subjective    HPI HPI     Sore Throat    Additional comments: Reports last night it started to get really scratchy and she woke up feeling pretty bad with drainage. Patient reports looking in her mouth and states it looks really red and bumpy. Patient has not taken anything today but she did take allergy medicine yesterday      Last edited by Acey Lav, CMA on 09/10/2022  9:43 AM.       Medications: Outpatient Medications Prior to Visit  Medication Sig   sertraline (ZOLOFT) 50 MG tablet Take 1 tablet (50 mg total) by mouth daily.   [DISCONTINUED] levocetirizine (XYZAL) 5 MG tablet Take 1 tablet (5 mg total) by mouth every evening.   [DISCONTINUED] albuterol (VENTOLIN HFA) 108 (90 Base) MCG/ACT inhaler Inhale 2 puffs into the lungs every 6 (six) hours as needed for wheezing or shortness of breath. (Patient not taking: Reported on 07/02/2022)   [DISCONTINUED] benzonatate (TESSALON) 200 MG capsule Take 1 capsule (200 mg total) by mouth 2 (two) times daily as needed for cough. (Patient not taking: Reported on 07/02/2022)   [DISCONTINUED] chlorpheniramine-HYDROcodone (TUSSIONEX) 10-8 MG/5ML Take 5 mLs by mouth at bedtime as needed for cough. (Patient not taking: Reported on 07/02/2022)   [DISCONTINUED] dexmethylphenidate (FOCALIN) 2.5 MG tablet Take 1 tablet (2.5 mg total) by mouth 2 (two) times daily. (Patient not taking: Reported on 09/10/2022)   [DISCONTINUED] methylPREDNISolone (MEDROL DOSEPAK) 4 MG TBPK tablet Take as directed on package; with food. (Patient not taking: Reported on 07/02/2022)   No facility-administered medications prior to visit.    Review  of Systems      Objective    BP 122/74 (BP Location: Right Arm, Patient Position: Sitting, Cuff Size: Normal)   Pulse 88   Temp 98.4 F (36.9 C) (Oral)   Ht 5\' 8"  (1.727 m)   Wt 207 lb 4.8 oz (94 kg)   SpO2 99%   BMI 31.52 kg/m    Physical Exam Vitals and nursing note reviewed.  Constitutional:      General: She is not in acute distress.    Appearance: Normal appearance. She is well-developed. She is obese. She is not ill-appearing, toxic-appearing or diaphoretic.  HENT:     Head: Normocephalic and atraumatic.     Right Ear: Tympanic membrane, ear canal and external ear normal.     Left Ear: Tympanic membrane, ear canal and external ear normal.     Nose:     Comments: Complaints of PND    Mouth/Throat:     Mouth: No oral lesions.     Pharynx: Oropharynx is clear. Uvula midline. Posterior oropharyngeal erythema present. No pharyngeal swelling, oropharyngeal exudate or uvula swelling.     Tonsils: No tonsillar exudate or tonsillar abscesses.  Eyes:     Conjunctiva/sclera: Conjunctivae normal.  Cardiovascular:     Rate and Rhythm: Normal rate and regular rhythm.     Pulses: Normal pulses.     Heart sounds: Normal heart sounds. No murmur heard.    No friction rub. No gallop.  Pulmonary:     Effort: Pulmonary effort is normal. No respiratory distress.     Breath  sounds: Normal breath sounds. No stridor. No wheezing, rhonchi or rales.  Chest:     Chest wall: No tenderness.  Musculoskeletal:        General: No swelling, tenderness, deformity or signs of injury. Normal range of motion.     Cervical back: Normal range of motion. No tenderness.     Right lower leg: No edema.     Left lower leg: No edema.  Skin:    General: Skin is warm and dry.     Capillary Refill: Capillary refill takes less than 2 seconds.     Coloration: Skin is not jaundiced or pale.     Findings: No bruising, erythema, lesion or rash.  Neurological:     General: No focal deficit present.     Mental  Status: She is alert and oriented to person, place, and time. Mental status is at baseline.     Cranial Nerves: No cranial nerve deficit.     Sensory: No sensory deficit.     Motor: No weakness.     Coordination: Coordination normal.  Psychiatric:        Mood and Affect: Mood normal.        Behavior: Behavior normal.        Thought Content: Thought content normal.        Judgment: Judgment normal.     Results for orders placed or performed in visit on 09/10/22  POC COVID-19  Result Value Ref Range   SARS Coronavirus 2 Ag Negative Negative    Assessment & Plan     Problem List Items Addressed This Visit       Respiratory   RESOLVED: Bronchitis   Relevant Medications   levocetirizine (XYZAL) 5 MG tablet   Non-seasonal allergic rhinitis    Chronic, with complaints of PND Request for POC COVID; no s/s of strep POC COVID negative Continue supportive care- If you start feeling worse with facial pain, high fever, cough, shortness of breath or start feeling significantly worse, please call us right away to be further evaluated.  Some things that can make you feel better are: - Increased rest - Increasing Fluids - Acetaminophen / ibuprofen as needed for fever/pain.  - Salt water gargling, chloraseptic spray and throat lozenges - OTC pseudoephedrine.  - Mucinex.  - Saline sinus flushes or a neti pot.  - Humidifying the air.       Relevant Medications   levocetirizine (XYZAL) 5 MG tablet     Other   Surveillance for medroxyprogesterone contraception    No concerns; Rx written for medication. Due for admin prior to 9/19      Other Visit Diagnoses     Acute sore throat    -  Primary   Relevant Orders   POC COVID-19 (Completed)      No follow-ups on file.     Leilani Merl, FNP, have reviewed all documentation for this visit. The documentation on 09/10/22 for the exam, diagnosis, procedures, and orders are all accurate and complete.  [name], FNP  [facility] [phone] (phone) [phone] (fax)  [facility]

## 2022-09-20 ENCOUNTER — Telehealth: Payer: Self-pay

## 2022-09-20 NOTE — Telephone Encounter (Signed)
OK to double book for dep

## 2022-09-20 NOTE — Telephone Encounter (Signed)
Copied from CRM [id number]. Topic: General - Other >> Sep 20, 2022 10:43 AM Franchot Heidelberg wrote: Reason for CRM: Pt reports that she was told to return to the office today with her medication. She wants to know what time so she does not have to wait for an extended time.

## 2022-09-21 ENCOUNTER — Ambulatory Visit (INDEPENDENT_AMBULATORY_CARE_PROVIDER_SITE_OTHER): Payer: 59 | Admitting: Family Medicine

## 2022-09-21 DIAGNOSIS — Z3042 Encounter for surveillance of injectable contraceptive: Secondary | ICD-10-CM

## 2022-09-21 MED ORDER — MEDROXYPROGESTERONE ACETATE 150 MG/ML IM SUSY
150.0000 mg | PREFILLED_SYRINGE | Freq: Once | INTRAMUSCULAR | Status: AC
Start: 2022-09-21 — End: 2022-09-21
  Administered 2022-09-21: 150 mg via INTRAMUSCULAR

## 2022-09-21 NOTE — Progress Notes (Signed)
Pt seen by CMA for medication administration; no further concerns. Within date range; defer POC pregnancy testing. No PE was performed by PCP.  [name], FNP  [facility] [address] [place], [place] [address] [phone] (phone) [phone] (fax) [facility]

## 2022-11-22 ENCOUNTER — Ambulatory Visit: Payer: Self-pay | Admitting: *Deleted

## 2022-11-22 NOTE — Progress Notes (Unsigned)
Established patient visit  Patient: Kimberly Cameron   DOB: January 13, 1995   27 y.o. Female  MRN: [id number] Visit Date: 11/23/2022  Today's healthcare provider: Debera Lat, PA-C   No chief complaint on file.  Subjective    HPI  *** Discussed the use of AI scribe software for clinical note transcription with the patient, who gave verbal consent to proceed.  History of Present Illness               06/24/2022    1:10 PM 12/31/2021   11:13 AM 10/19/2021    8:43 AM  Depression screen PHQ 2/9  Decreased Interest 0 0 0  Down, Depressed, Hopeless 0 0 0  PHQ - 2 Score 0 0 0  Altered sleeping 1  1  Tired, decreased energy 1  1  Change in appetite 0  0  Feeling bad or failure about yourself  0  1  Trouble concentrating 1  0  Moving slowly or fidgety/restless 0  0  Suicidal thoughts 0  0  PHQ-9 Score 3  3  Difficult doing work/chores Not difficult at all  Not difficult at all      04/25/2020   11:51 AM 12/19/2019   10:56 AM  GAD 7 : Generalized Anxiety Score  Nervous, Anxious, on Edge 1 3  Control/stop worrying 1 3  Worry too much - different things 1 1  Trouble relaxing 2 1  Restless 0 0  Easily annoyed or irritable 1 0  Afraid - awful might happen 0 0  Total GAD 7 Score 6 8  Anxiety Difficulty Very difficult Not difficult at all    Medications: Outpatient Medications Prior to Visit  Medication Sig   levocetirizine (XYZAL) 5 MG tablet Take 1 tablet (5 mg total) by mouth every evening.   medroxyPROGESTERone (DEPO-PROVERA) 150 MG/ML injection Inject 1 mL (150 mg total) into the muscle every 3 (three) months. Bring medication to PCP for administration; package needs to be sealed/closed.   sertraline (ZOLOFT) 50 MG tablet Take 1 tablet (50 mg total) by mouth daily.   No facility-administered medications prior to visit.    Review of Systems  All other systems reviewed and are negative.  Except see HPI   {Insert previous labs (optional):23779} {See past labs   Heme  Chem  Endocrine  Serology  Results Review (optional):1}   Objective    There were no vitals taken for this visit. {Insert last BP/Wt (optional):23777}{See vitals history (optional):1}   Physical Exam Vitals reviewed.  Constitutional:      General: She is not in acute distress.    Appearance: Normal appearance. She is well-developed. She is not diaphoretic.  HENT:     Head: Normocephalic and atraumatic.  Eyes:     General: No scleral icterus.    Conjunctiva/sclera: Conjunctivae normal.  Neck:     Thyroid: No thyromegaly.  Cardiovascular:     Rate and Rhythm: Normal rate and regular rhythm.     Pulses: Normal pulses.     Heart sounds: Normal heart sounds. No murmur heard. Pulmonary:     Effort: Pulmonary effort is normal. No respiratory distress.     Breath sounds: Normal breath sounds. No wheezing, rhonchi or rales.  Musculoskeletal:     Cervical back: Neck supple.     Right lower leg: No edema.     Left lower leg: No edema.  Lymphadenopathy:     Cervical: No cervical adenopathy.  Skin:    General: Skin is warm  and dry.     Findings: No rash.  Neurological:     Mental Status: She is alert and oriented to person, place, and time. Mental status is at baseline.  Psychiatric:        Mood and Affect: Mood normal.        Behavior: Behavior normal.      No results found for any visits on 11/23/22.  Assessment & Plan      No follow-ups on file.     The patient was advised to call back or seek an in-person evaluation if the symptoms worsen or if the condition fails to improve as anticipated.  I discussed the assessment and treatment plan with the patient. The patient was provided an opportunity to ask questions and all were answered. The patient agreed with the plan and demonstrated an understanding of the instructions.  I, Debera Lat, PA-C have reviewed all documentation for this visit. The documentation on  11/23/22 for the exam, diagnosis, procedures, and  orders are all accurate and complete.  Debera Lat, [facility], MMS [facility] [phone] (phone) [phone] (fax)  [facility]

## 2022-11-22 NOTE — Telephone Encounter (Signed)
  Chief Complaint: Not feeling well.   Heart beating fast can feel it in her neck. Symptoms: dizziness, hot spells, sweating, lymph nodes in neck feel swollen.   Frequency: Intermittently  Pertinent Negatives: Patient denies passing out.    Disposition: [] ED /[] Urgent Care (no appt availability in office) / [x] Appointment(In office/virtual)/ []  [place] Virtual [facility]/ [] Home Care/ [] Refused Recommended Disposition /[] [place] Mobile Bus/ []  Follow-up with PCP Additional Notes: Appt made with Debera Lat, PA-C for 8:20 11/23/2022.

## 2022-11-22 NOTE — Telephone Encounter (Signed)
Reason for Disposition  [1] Palpitations AND [2] no improvement after using Care Advice  Answer Assessment - Initial Assessment Questions 1. DESCRIPTION: "Please describe your heart rate or heartbeat that you are having" (e.g., fast/slow, regular/irregular, skipped or extra beats, "palpitations")     I just got out of shower I'm so hot, my face is sweating, I can feel my heart beating in my lymph nodes.   It'll last 15-20 min.   Then goes away.   If I'm [place] I get dizzy and shaky and nausea.   I don't have to be real [place] for this happen.    2. ONSET: "When did it start?" (Minutes, hours or days)      A couple of months.   Intermittently.   I can run and play with kids.  But if I bend over that when these symptoms hit me.    3. DURATION: "How long does it last" (e.g., seconds, minutes, hours)     above 4. PATTERN "Does it come and go, or has it been constant since it started?"  "Does it get worse with exertion?"   "Are you feeling it now?"     Intermittently  5. TAP: "Using your hand, can you tap out what you are feeling on a chair or table in front of you, so that I can hear?" (Note: not all patients can do this)       Not asked 6. HEART RATE: "Can you tell me your heart rate?" "How many beats in 15 seconds?"  (Note: not all patients can do this)       No 7. RECURRENT SYMPTOM: "Have you ever had this before?" If Yes, ask: "When was the last time?" and "What happened that time?"      For last 2 months 8. CAUSE: "What do you think is causing the palpitations?"     I don't know 9. CARDIAC HISTORY: "Do you have any history of heart disease?" (e.g., heart attack, angina, bypass surgery, angioplasty, arrhythmia)      No 10. OTHER SYMPTOMS: "Do you have any other symptoms?" (e.g., dizziness, chest pain, sweating, difficulty breathing)       I have sinus congestion too 11. PREGNANCY: "Is there any chance you are pregnant?" "When was your last menstrual period?"       Not asked  Protocols  used: Heart Rate and Heartbeat Questions-A-AH

## 2022-11-23 ENCOUNTER — Ambulatory Visit (INDEPENDENT_AMBULATORY_CARE_PROVIDER_SITE_OTHER): Payer: 59 | Admitting: Physician Assistant

## 2022-11-23 ENCOUNTER — Encounter: Payer: Self-pay | Admitting: Physician Assistant

## 2022-11-23 VITALS — BP 114/73 | HR 86 | Resp 16 | Wt 203.2 lb

## 2022-11-23 DIAGNOSIS — R635 Abnormal weight gain: Secondary | ICD-10-CM

## 2022-11-23 DIAGNOSIS — Z8279 Family history of other congenital malformations, deformations and chromosomal abnormalities: Secondary | ICD-10-CM | POA: Diagnosis not present

## 2022-11-23 DIAGNOSIS — J3089 Other allergic rhinitis: Secondary | ICD-10-CM

## 2022-11-23 DIAGNOSIS — Z833 Family history of diabetes mellitus: Secondary | ICD-10-CM

## 2022-11-23 DIAGNOSIS — R002 Palpitations: Secondary | ICD-10-CM

## 2022-11-23 DIAGNOSIS — R42 Dizziness and giddiness: Secondary | ICD-10-CM

## 2022-11-24 DIAGNOSIS — R002 Palpitations: Secondary | ICD-10-CM | POA: Insufficient documentation

## 2022-11-24 DIAGNOSIS — Z833 Family history of diabetes mellitus: Secondary | ICD-10-CM | POA: Insufficient documentation

## 2022-11-24 DIAGNOSIS — R42 Dizziness and giddiness: Secondary | ICD-10-CM | POA: Insufficient documentation

## 2022-11-24 LAB — CBC WITH DIFFERENTIAL/PLATELET
Basophils Absolute: 0 10*3/uL (ref 0.0–0.2)
Basos: 1 %
EOS (ABSOLUTE): 0.1 10*3/uL (ref 0.0–0.4)
Eos: 2 %
Hematocrit: 44 % (ref 34.0–46.6)
Hemoglobin: 14 g/dL (ref 11.1–15.9)
Immature Grans (Abs): 0 10*3/uL (ref 0.0–0.1)
Immature Granulocytes: 0 %
Lymphocytes Absolute: 2.2 10*3/uL (ref 0.7–3.1)
Lymphs: 35 %
MCH: 29.1 pg (ref 26.6–33.0)
MCHC: 31.8 g/dL (ref 31.5–35.7)
MCV: 92 fL (ref 79–97)
Monocytes Absolute: 0.5 10*3/uL (ref 0.1–0.9)
Monocytes: 8 %
Neutrophils Absolute: 3.5 10*3/uL (ref 1.4–7.0)
Neutrophils: 54 %
Platelets: 352 10*3/uL (ref 150–450)
RBC: 4.81 x10E6/uL (ref 3.77–5.28)
RDW: 12.6 % (ref 11.7–15.4)
WBC: 6.4 10*3/uL (ref 3.4–10.8)

## 2022-11-24 LAB — COMPREHENSIVE METABOLIC PANEL
ALT: 18 [IU]/L (ref 0–32)
AST: 18 [IU]/L (ref 0–40)
Albumin: 4.2 g/dL (ref 4.0–5.0)
Alkaline Phosphatase: 101 [IU]/L (ref 44–121)
BUN/Creatinine Ratio: 12 (ref 9–23)
BUN: 10 mg/dL (ref 6–20)
Bilirubin Total: 0.5 mg/dL (ref 0.0–1.2)
CO2: 22 mmol/L (ref 20–29)
Calcium: 9.4 mg/dL (ref 8.7–10.2)
Chloride: 106 mmol/L (ref 96–106)
Creatinine, Ser: 0.84 mg/dL (ref 0.57–1.00)
Globulin, Total: 2.5 g/dL (ref 1.5–4.5)
Glucose: 101 mg/dL — ABNORMAL HIGH (ref 70–99)
Potassium: 4.5 mmol/L (ref 3.5–5.2)
Sodium: 142 mmol/L (ref 134–144)
Total Protein: 6.7 g/dL (ref 6.0–8.5)
eGFR: 97 mL/min/{1.73_m2} (ref >59)

## 2022-11-24 LAB — HEMOGLOBIN A1C
Est. average glucose Bld gHb Est-mCnc: 108 mg/dL
Hgb A1c MFr Bld: 5.4 % (ref 4.8–5.6)

## 2022-11-24 LAB — TSH: TSH: 2.19 u[IU]/mL (ref 0.450–4.500)

## 2022-11-29 ENCOUNTER — Other Ambulatory Visit: Payer: Self-pay | Admitting: Family Medicine

## 2022-11-29 ENCOUNTER — Telehealth: Payer: Self-pay | Admitting: Family Medicine

## 2022-11-29 MED ORDER — DEXMETHYLPHENIDATE HCL 2.5 MG PO TABS: 2.5000 mg | ORAL_TABLET | Freq: Two times a day (BID) | ORAL | 0 refills | Status: DC

## 2022-11-29 NOTE — Telephone Encounter (Signed)
Medication Refill -  Most Recent Primary Care Visit:  Provider: Debera Lat  Department: [facility]  Visit Type: OFFICE VISIT  Date: 11/23/2022  Medication: socalin 2.5   Has the patient contacted their pharmacy? no Pt thought she had to call in , because the med doesn't show on her med list Is this the correct pharmacy for this prescription? yes  This is the patient's preferred pharmacy:  [id number] [place], [place] - [address] [place] [address] Phone: [phone] Fax: [phone]   Has the prescription been filled recently? no  Is the patient out of the medication? No, has one left  Has the patient been seen for an appointment in the last year OR does the patient have an upcoming appointment? yes  Can we respond through MyChart? yes  Agent: Please be advised that Rx refills may take up to 3 business days. We ask that you follow-up with your pharmacy.

## 2022-12-01 ENCOUNTER — Other Ambulatory Visit: Payer: Self-pay | Admitting: Family Medicine

## 2022-12-01 NOTE — Telephone Encounter (Signed)
Requested Prescriptions  Pending Prescriptions Disp Refills   medroxyPROGESTERone (DEPO-PROVERA) 150 MG/ML injection [Pharmacy Med Name: MEDROXYPROGESTERONE 150 MG/ML] 1 mL 0    Sig: INJECT 1 ML INTO THE MUSCLE EVERY 3 MONTHS. BRING MEDICATION TO PCP FOR ADMINISTRATION PACKAGE NEEDS TO BE SEALED/CLOSED     Off-Protocol Failed - 12/01/2022 10:32 AM      Failed - Medication not assigned to a protocol, review manually.      Passed - Valid encounter within last 12 months    Recent Outpatient Visits           1 week ago Dizziness   [place] [facility] [place], [place], [place]   2 months ago Acute sore throat   [place] [facility] [name], FNP   5 months ago Middle ear effusion, bilateral   [place] [address] [facility] Mecum, Oswaldo Conroy, PA-C   5 months ago Bronchitis   [facility] [name], FNP   5 months ago Surveillance for medroxyprogesterone contraception   [facility] [name], FNP

## 2022-12-12 ENCOUNTER — Other Ambulatory Visit: Payer: Self-pay | Admitting: Family Medicine

## 2022-12-13 NOTE — Telephone Encounter (Signed)
Please advise 

## 2022-12-15 ENCOUNTER — Ambulatory Visit (INDEPENDENT_AMBULATORY_CARE_PROVIDER_SITE_OTHER): Payer: 59 | Admitting: Family Medicine

## 2022-12-15 ENCOUNTER — Encounter: Payer: Self-pay | Admitting: Family Medicine

## 2022-12-15 ENCOUNTER — Ambulatory Visit: Payer: Self-pay

## 2022-12-15 VITALS — Wt 205.0 lb

## 2022-12-15 DIAGNOSIS — Z3042 Encounter for surveillance of injectable contraceptive: Secondary | ICD-10-CM

## 2022-12-15 MED ORDER — MEDROXYPROGESTERONE ACETATE 150 MG/ML IM SUSP
150.0000 mg | Freq: Once | INTRAMUSCULAR | Status: AC
  Administered 2022-12-15: 150 mg via INTRAMUSCULAR

## 2022-12-15 NOTE — Progress Notes (Signed)
Administered medroxyprogesterone 150 mg/ml left ventrogluteal. Pt tolerated well  I, [name], FNP, have reviewed all documentation for this visit. The documentation on 12/15/22 for the exam, diagnosis, procedures, and orders are all accurate and complete.

## 2022-12-15 NOTE — Telephone Encounter (Signed)
Summary: Depo Side Effect   Pt is calling in because she got the Depo shot today and has been feeling nauseas since she got the shot and she wants to know if that is a side effect of the shot or if it's something she should be concerned about.     Chief Complaint: Nausea 3 hours after Depo shot."I've never had that happen before." Symptoms: Above, will try Pepto Frequency: Today Pertinent Negatives: Patient denies vomiting Disposition: [] ED /[] Urgent Care (no appt availability in office) / [] Appointment(In office/virtual)/ []  [place] Virtual Care/ [] Home Care/ [] Refused Recommended Disposition /[] [place]  Mobile Bus/ [x]  Follow-up with PCP Additional Notes: Please advise pt.  Reason for Disposition  [1] Caller has URGENT medicine question about med that PCP or specialist prescribed AND [2] triager unable to answer question  Answer Assessment - Initial Assessment Questions 1. NAME of MEDICINE: "What medicine(s) are you calling about?"     Depo shot 2. QUESTION: "What is your question?" (e.g., double dose of medicine, side effect)     Can it cause nausea  3. PRESCRIBER: "Who prescribed the medicine?" Reason: if prescribed by specialist, call should be referred to that group.     [place] 4. SYMPTOMS: "Do you have any symptoms?" If Yes, ask: "What symptoms are you having?"  "How bad are the symptoms (e.g., mild, moderate, severe)     Nausea 5. PREGNANCY:  "Is there any chance that you are pregnant?" "When was your last menstrual period?"     No  Protocols used: Medication Question Call-A-AH

## 2023-01-18 ENCOUNTER — Other Ambulatory Visit (HOSPITAL_COMMUNITY): Payer: Self-pay

## 2023-01-31 ENCOUNTER — Encounter: Payer: Self-pay | Admitting: Family Medicine

## 2023-01-31 ENCOUNTER — Telehealth (INDEPENDENT_AMBULATORY_CARE_PROVIDER_SITE_OTHER): Payer: 59 | Admitting: Family Medicine

## 2023-01-31 ENCOUNTER — Ambulatory Visit: Payer: Self-pay

## 2023-01-31 DIAGNOSIS — R110 Nausea: Secondary | ICD-10-CM | POA: Diagnosis not present

## 2023-01-31 DIAGNOSIS — R232 Flushing: Secondary | ICD-10-CM | POA: Diagnosis not present

## 2023-01-31 DIAGNOSIS — R635 Abnormal weight gain: Secondary | ICD-10-CM | POA: Diagnosis not present

## 2023-01-31 DIAGNOSIS — Z3009 Encounter for other general counseling and advice on contraception: Secondary | ICD-10-CM

## 2023-01-31 MED ORDER — ONDANSETRON HCL 4 MG PO TABS: 4.0000 mg | ORAL_TABLET | Freq: Three times a day (TID) | ORAL | 0 refills | Status: DC | PRN

## 2023-01-31 NOTE — Progress Notes (Signed)
MyChart Video Visit    Virtual Visit via Video Note   This format is felt to be most appropriate for this patient at this time. Physical exam was limited by quality of the video and audio technology used for the visit.    Patient location: home Provider location: [facility] Persons involved in the visit: patient, provider  I discussed the limitations of evaluation and management by telemedicine and the availability of in person appointments. The patient expressed understanding and agreed to proceed.  Patient: Kimberly Cameron   DOB: 1995/03/01   28 y.o. Female  MRN: [id number] Visit Date: 01/31/2023  Today's healthcare provider: Shirlee Latch, MD   No chief complaint on file.  Subjective    HPI   Discussed the use of AI scribe software for clinical note transcription with the patient, who gave verbal consent to proceed.  History of Present Illness   Marga Gramajo, a patient with a history of using Depo-Provera for birth control and Zoloft for mental health management, presents with episodes of intense heat, dizziness, and nausea. These episodes have been increasing in severity and duration over the past couple of months. Initially, they would last for about 5-10 minutes, but they now last up to an hour, with the nausea sometimes persisting for up to three hours. These episodes often occur after waking up and sometimes after eating.  In addition to these symptoms, the patient has been experiencing headaches, which have been previously attributed to sinus issues. She also reports feeling bloated and has noticed changes in her body composition, including the development of stretch marks on her stomach, despite her weight remaining the same. She has been consistent with her Zoloft medication and has not noticed any correlation between these symptoms and her medication.  The patient suspects that these symptoms may be related to her Depo shot, which she has been  using for over a year. She has expressed a desire to change her birth control method due to these symptoms and the potential risk of brain tumors associated with Depo-Provera. She is open to the idea of an IUD but is concerned about the pain associated with its insertion.       Review of Systems      Objective    There were no vitals taken for this visit.      Physical Exam Constitutional:      General: She is not in acute distress.    Appearance: Normal appearance.  HENT:     Head: Normocephalic.  Pulmonary:     Effort: Pulmonary effort is normal. No respiratory distress.  Neurological:     Mental Status: She is alert and oriented to person, place, and time. Mental status is at baseline.        Assessment & Plan     Problem List Items Addressed This Visit   None Visit Diagnoses       Flushing    -  Primary     Nausea         Encounter for other general counseling or advice on contraception       Relevant Orders   Ambulatory referral to Gynecology     Weight gain       Relevant Orders   Amb ref to Medical Nutrition Therapy-MNT           Recurrent Hot Flashes, Dizziness, and Nausea Intermittent severe hot flashes, dizziness, and nausea lasting up to an hour, with nausea persisting for up  to three hours. Symptoms have worsened over the past few months. Likely side effects from Depo-Provera, given the timing of injections and consistent Zoloft use. - Discontinue Depo-Provera. - Prescribe Zofran for nausea, to be taken up to three times a day as needed.  Weight Gain and Body Composition Changes Weight gain, new stretch marks, and bloating despite a healthy diet and active lifestyle, likely related to Depo-Provera. - Discontinue Depo-Provera. - Refer to a dietician for nutritional counseling.  Headaches Recurrent headaches, possibly linked to Depo-Provera. - Monitor headache frequency and severity after discontinuing Depo-Provera.  Birth Control  Management Significant side effects from Depo-Provera. Discussed alternative methods including the ring, patches, and IUD. Patient is interested in IUD for long-term benefits but concerned about insertion pain. Discussed pain control options and scheduling flexibility. - Refer to OB/GYN for IUD consultation and potential insertion. - Discuss pain control options with OB/GYN.  General Health Maintenance Generally healthy but experiencing side effects from current birth control method. - Encourage continued healthy diet and exercise.  Follow-up - Follow up with OB/GYN for IUD consultation. - Follow up with dietician for nutritional counseling.       Meds ordered this encounter  Medications   ondansetron (ZOFRAN) 4 MG tablet    Sig: Take 1 tablet (4 mg total) by mouth every 8 (eight) hours as needed for nausea or vomiting.    Dispense:  20 tablet    Refill:  0     Return if symptoms worsen or fail to improve.     I discussed the assessment and treatment plan with the patient. The patient was provided an opportunity to ask questions and all were answered. The patient agreed with the plan and demonstrated an understanding of the instructions.   The patient was advised to call back or seek an in-person evaluation if the symptoms worsen or if the condition fails to improve as anticipated.   Shirlee Latch, MD [facility] [phone] (phone) [phone] (fax)  [facility]

## 2023-01-31 NOTE — Telephone Encounter (Signed)
  Chief Complaint: nausea, hot flashes, dizziness Symptoms: above Frequency: this has been going on for several moths Pertinent Negatives: Patient denies  Disposition: [] ED /[] Urgent Care (no appt availability in office) / [x] Appointment(In office/virtual)/ []  [place] Virtual Care/ [] Home Care/ [] Refused Recommended Disposition /[]  Mobile Bus/ []  Follow-up with PCP Additional Notes: Pt has been seen for this in November w/o resolution. Pt states she has hot flashes that can last an hour, Face flushing, nausea, and dizziness.  MyChart visit scheduled for this afternoon.  Reason for Disposition  [1] MODERATE dizziness (e.g., interferes with normal activities) AND [2] has been evaluated by doctor (or NP/PA) for this  Answer Assessment - Initial Assessment Questions 1. DESCRIPTION: "Describe your dizziness."     Dizziness, Hot flashes, Nausea  Protocols used: Dizziness - Lightheadedness-A-AH

## 2023-02-22 ENCOUNTER — Telehealth: Payer: Self-pay | Admitting: Physician Assistant

## 2023-02-22 ENCOUNTER — Other Ambulatory Visit: Payer: Self-pay

## 2023-02-22 NOTE — Telephone Encounter (Signed)
 [facility] day request sertraline (ZOLOFT) 50 MG tablet   Please advise

## 2023-02-25 ENCOUNTER — Telehealth: Payer: Self-pay | Admitting: Family Medicine

## 2023-02-25 ENCOUNTER — Ambulatory Visit (INDEPENDENT_AMBULATORY_CARE_PROVIDER_SITE_OTHER): Payer: 59 | Admitting: Advanced Practice Midwife

## 2023-02-25 ENCOUNTER — Encounter: Payer: Self-pay | Admitting: Family Medicine

## 2023-02-25 ENCOUNTER — Encounter: Payer: Self-pay | Admitting: Advanced Practice Midwife

## 2023-02-25 VITALS — BP 116/71 | HR 106 | Ht 68.0 in | Wt 203.0 lb

## 2023-02-25 DIAGNOSIS — Z3009 Encounter for other general counseling and advice on contraception: Secondary | ICD-10-CM | POA: Diagnosis not present

## 2023-02-25 DIAGNOSIS — Z1321 Encounter for screening for nutritional disorder: Secondary | ICD-10-CM | POA: Diagnosis not present

## 2023-02-25 DIAGNOSIS — Z30015 Encounter for initial prescription of vaginal ring hormonal contraceptive: Secondary | ICD-10-CM | POA: Diagnosis not present

## 2023-02-25 MED ORDER — ETONOGESTREL-ETHINYL ESTRADIOL 0.12-0.015 MG/24HR VA RING: VAGINAL_RING | VAGINAL | 11 refills | Status: DC

## 2023-02-25 NOTE — Telephone Encounter (Signed)
 LOV: 11/23/2022 NOV: N/A LRF: 12/14/2022 #[id number]

## 2023-02-25 NOTE — Telephone Encounter (Signed)
 Medication was already disconnected by OBGYN stated by [place] PA

## 2023-02-25 NOTE — Telephone Encounter (Signed)
 [facility] is requesting refill sertraline (ZOLOFT) 50 MG tablet  Please advise

## 2023-02-25 NOTE — Telephone Encounter (Signed)
 Called into [facility] and stated she no longer wanted her 2/28 visit or her depo shot she wanted to cancel the prescription of it all together

## 2023-02-26 LAB — VITAMIN D 25 HYDROXY (VIT D DEFICIENCY, FRACTURES): Vit D, 25-Hydroxy: 24.1 ng/mL — ABNORMAL LOW (ref 30.0–100.0)

## 2023-02-27 ENCOUNTER — Encounter: Payer: Self-pay | Admitting: Advanced Practice Midwife

## 2023-02-27 NOTE — Patient Instructions (Signed)
 Etonogestrel; Ethinyl Estradiol Vaginal Ring What is this medication? ETONOGESTREL; ETHINYL ESTRADIOL (et oh noe JES trel; ETH in il es tra DYE ole) prevents ovulation and pregnancy. It belongs to a group of medications called contraceptives. It is a combination of the hormones estrogen and progestin. This medicine may be used for other purposes; ask your health care provider or pharmacist if you have questions. COMMON BRAND NAME(S): EluRyng, EnilloRing, HALOETTE, NuvaRing What should I tell my care team before I take this medication? They need to know if you have any of these conditions: Abnormal vaginal bleeding Blood clots Blood vessel disease Breast, cervical, endometrial, ovarian, liver, or uterine cancer Diabetes Gallbladder disease Having surgery Heart disease or recent heart attack High blood pressure High cholesterol or triglycerides History of irregular heartbeat or heart valve problems Kidney disease Liver disease Lupus Migraine headaches Protein C or S deficiency Recently had a baby, miscarriage, or abortion Stroke Tobacco use An unusual or allergic reaction to estrogens, progestins, other medications, foods, dyes, or preservatives Pregnant or trying to get pregnant Breastfeeding How should I use this medication? Insert the ring into your vagina as directed. Follow the directions on the prescription label. The ring will remain place for 3 weeks and is then removed for a 1-week break. A new ring is inserted 1 week after the last ring was removed, on the same day of the week. Check often to make sure the ring is still in place. If the ring was out of the vagina for an unknown amount of time, you may not be protected from pregnancy. Perform a pregnancy test and call your care team. Do not use more often than directed. A patient package insert for the product will be given with each prescription and refill. Read this sheet carefully each time. The sheet may change  frequently. Contact your care team regarding the use of this medication in children. Special care may be needed. Overdosage: If you think you have taken too much of this medicine contact a poison control center or emergency room at once. NOTE: This medicine is only for you. Do not share this medicine with others. What if I miss a dose? You will need to use the ring exactly as directed. It is very important to follow the schedule every cycle. If you do not use the ring as directed, you may not be protected from pregnancy. If the ring should slip out, is lost, or if you leave it in longer or shorter than you should, contact your care team for advice. What may interact with this medication? Do not take this medication with the following: Dasabuvir; ombitasvir; paritaprevir; ritonavir Ombitasvir; paritaprevir; ritonavir Vaginal lubricants or other vaginal products that are oil-based or silicone-based This medication may also interact with the following: Acetaminophen Antibiotics or medications for infections, especially rifampin, rifabutin, rifapentine, griseofulvin, penicillins, or tetracyclines Aprepitant or fosaprepitant Armodafinil Ascorbic acid (vitamin C) Barbiturate medications, such as phenobarbital or primidone Bosentan Certain antivirals for HIV or hepatitis Certain medications for cancer treatment Certain medications for cholesterol Certain medications for seizures, such as carbamazepine, clobazam, felbamate, lamotrigine, oxcarbazepine, phenytoin, rufinamide, or topiramate Cyclosporine Dantrolene Elagolix Flibanserin Grapefruit juice Lesinurad Medications for diabetes Medications to treat fungal infections, such as griseofulvin, miconazole, fluconazole, ketoconazole, itraconazole, posaconazole, or voriconazole Mifepristone Mitotane Modafinil Morphine Mycophenolate St. John's wort Tamoxifen Temazepam Theophylline or aminophylline Thyroid hormones Tizanidine Tranexamic  acid Ulipristal Warfarin This list may not describe all possible interactions. Give your health care provider a list of all the medicines, herbs,  non-prescription drugs, or dietary supplements you use. Also tell them if you smoke, drink alcohol, or use illegal drugs. Some items may interact with your medicine. What should I watch for while using this medication? Visit your care team for regular checks on your progress. You will need a regular breast and pelvic exam and Pap smear while on this medication. Check with your care team to see if you need an additional method of contraception during the first cycle that you use this ring. Female condoms (made with natural rubber latex, polyisoprene, and polyurethane) and spermicides may be used. Do not use a diaphragm, cervical cap, or a female condom, as the ring can interfere with these birth control methods and their proper placement. If you have any reason to think you are pregnant, stop using this medication right away and contact your care team. If you are using this medication for hormone related problems, it may take several cycles of use to see improvement in your condition. Smoking tobacco increases the risk of getting a blood clot or having a stroke while you are taking this medication, especially if you are older than 35 years. You may get dark patches on your face (chloasma) while taking this medication. If you noticed dark patches on your face during a pregnancy, your risk of getting it is higher. Keep out of the sun. If you cannot avoid the sun, wear protective clothing and use sunscreen. Do not use sun lamps, tanning beds, or tanning booths. This medication can make your body retain fluid, making your fingers, hands, or ankles swell. Your blood pressure can go up. Contact your care team if you feel you are retaining fluid. If you are going to have elective surgery, you may need to stop using this medication before the surgery. Consult your care  team for advice. Using this medication does not protect you or your partner against HIV or other sexually transmitted infections (STIs). What side effects may I notice from receiving this medication? Side effects that you should report to your care team as soon as possible: Allergic reactions--skin rash, itching, hives, swelling of the face, lips, tongue, or throat Blood clot--pain, swelling, or warmth in the leg, shortness of breath, chest pain Gallbladder problems--severe stomach pain, nausea, vomiting, fever Increase in blood pressure Liver injury--right upper belly pain, loss of appetite, nausea, light-colored stool, dark yellow or brown urine, yellowing skin or eyes, unusual weakness or fatigue New or worsening migraines or headaches Stroke--sudden numbness or weakness of the face, arm, or leg, trouble speaking, confusion, trouble walking, loss of balance or coordination, dizziness, severe headache, change in vision Toxic shock syndrome--fever, headache, general discomfort and fatigue, vomiting, diarrhea, rash or peeling of the skin over hands or feet Unusual vaginal discharge, itching, or odor Vaginal pain, irritation, or sores Worsening mood, feelings of depression Side effects that usually do not require medical attention (report to your care team if they continue or are bothersome): Breast pain or tenderness Dark patches of skin on the face or other sun-exposed areas Irregular menstrual cycles or spotting Nausea Weight gain This list may not describe all possible side effects. Call your doctor for medical advice about side effects. You may report side effects to FDA at 1-800-FDA-1088. Where should I keep my medication? Keep out of the reach of children and pets. Store unopened medication for up to 4 months at room temperature at 15 and 30 degrees C (59 and 86 degrees F). Protect from light. Do not store above 30 degrees  C (86 degrees F). Throw away any unused medication 4 months  after the dispense date or the expiration date, whichever comes first. A ring may only be used for 1 cycle (1 month). After the 3-week cycle, a used ring is removed and should be placed in the re-closable foil pouch and discarded in the trash out of reach of children and pets. Do NOT flush down the toilet. NOTE: This sheet is a summary. It may not cover all possible information. If you have questions about this medicine, talk to your doctor, pharmacist, or health care provider.  2024 Elsevier/Gold Standard (2021-07-28 00:00:00)

## 2023-02-27 NOTE — Progress Notes (Signed)
 Patient ID: Kimberly Cameron, female   DOB: 05-03-1995, 28 y.o.   MRN: [id number]  Reason for Consult: Contraception (Currently on depo would like to switch to different method. Dizziness, nausea and hot flashes, feels bloated, weight gain x couple of months.)  Subjective:  Date of Service: 02/25/2023  HPI:  Kimberly Cameron is a 28 y.o. female being seen to discuss a change of contraception. She has been having symptoms of; feeling hot followed by nausea, dizziness and rapid heartbeat for the past several months. Her symptoms can last for 1-3 hours. She reports weight gain in the past year- 7 pounds since annual visit 1 year ago. She denies an increase in stress or anxiety. No major changes in diet. Active at childcare job. She was seen by PCP in November and again in January for these symptoms.   She has used Depo Provera since May of 2023. Discussion with PCP regarding possible connection of symptoms to use of Depo. We reviewed birth control options. She would like to try vaginal ring. Advised allow for adjustment time.    Past Medical History:  Diagnosis Date   ADHD    Anxiety    GERD (gastroesophageal reflux disease)    Family History  Problem Relation Age of Onset   Diabetes Father    Clotting disorder Brother    Past Surgical History:  Procedure Laterality Date   TONSILLECTOMY     in 7th grade   WISDOM TOOTH EXTRACTION      Short Social History:  Social History   Tobacco Use   Smoking status: Never   Smokeless tobacco: Never  Substance Use Topics   Alcohol use: Yes    Comment: very rarely    No Known Allergies  Current Outpatient Medications  Medication Sig Dispense Refill   etonogestrel-ethinyl estradiol (NUVARING) 0.12-0.015 MG/24HR vaginal ring Insert vaginally and leave in place for 3 consecutive weeks, then remove for 1 week. 1 each 11   levocetirizine (XYZAL) 5 MG tablet Take 1 tablet (5 mg total) by mouth every evening. 90 tablet 3   ondansetron  (ZOFRAN) 4 MG tablet Take 1 tablet (4 mg total) by mouth every 8 (eight) hours as needed for nausea or vomiting. 20 tablet 0   sertraline (ZOLOFT) 50 MG tablet TAKE 1 TABLET BY MOUTH EVERY DAY 30 tablet 5   dexmethylphenidate (FOCALIN) 2.5 MG tablet Take 1 tablet (2.5 mg total) by mouth 2 (two) times daily. (Patient not taking: Reported on 02/25/2023) 60 tablet 0   No current facility-administered medications for this visit.    Review of Systems  Constitutional:  Negative for chills and fever.       Positive for weight gain  HENT:  Negative for congestion, ear discharge, ear pain, hearing loss, sinus pain and sore throat.   Eyes:  Negative for blurred vision and double vision.  Respiratory:  Negative for cough, shortness of breath and wheezing.   Cardiovascular:  Negative for chest pain, palpitations and leg swelling.       Positive for rapid heart beat  Gastrointestinal:  Positive for nausea. Negative for abdominal pain, blood in stool, constipation, diarrhea, heartburn, melena and vomiting.  Genitourinary:  Negative for dysuria, flank pain, frequency, hematuria and urgency.  Musculoskeletal:  Negative for back pain, joint pain and myalgias.  Skin:  Negative for itching and rash.  Neurological:  Positive for dizziness. Negative for tingling, tremors, sensory change, speech change, focal weakness, seizures, loss of consciousness, weakness and headaches.  Endo/Heme/Allergies:  Negative for environmental allergies. Does not bruise/bleed easily.       Positive for feeling hot  Psychiatric/Behavioral:  Negative for depression, hallucinations, memory loss, substance abuse and suicidal ideas. The patient is not nervous/anxious and does not have insomnia.        Objective:  Objective   Vitals:   02/25/23 1121  BP: 116/71  Pulse: (!) 106  Weight: 203 lb (92.1 kg)  Height: 5\' 8"  (1.727 m)   Body mass index is 30.87 kg/m. Constitutional: Well nourished, well developed female in no acute  distress.  HEENT: normal Skin: Warm and dry.  Cardiovascular: Regular rate and rhythm.   Extremity:  no edema   Respiratory: Clear to auscultation bilateral. Normal respiratory effort Psych: Alert and Oriented x3. No memory deficits. Normal mood and affect.    Data:  Latest Reference Range & Units 02/25/23 12:05  Vitamin D, 25-Hydroxy 30.0 - 100.0 ng/mL 24.1 (L)  (L): Data is abnormally low     Assessment/Plan:     28 y.o. G0 P0 female birth control consult, change of method due to potential side effects of current method (Depo)  Rx NuvaRing vaginal insert Lab: vitamin D level (assess for deficiency) Follow up as needed   Tresea Mall, CNM [facility] 02/27/2023 1:46 PM

## 2023-03-02 ENCOUNTER — Encounter (INDEPENDENT_AMBULATORY_CARE_PROVIDER_SITE_OTHER): Payer: Self-pay

## 2023-03-04 ENCOUNTER — Ambulatory Visit: Payer: Self-pay | Admitting: Family Medicine

## 2023-03-11 ENCOUNTER — Telehealth: Payer: Self-pay | Admitting: Family Medicine

## 2023-03-11 NOTE — Telephone Encounter (Signed)
 Patient dropped [employer] Medical Examiner form to be completed since she is taking Focalin.  Please call patient when completed to pick up form  [phone]

## 2023-03-14 NOTE — Telephone Encounter (Signed)
 Completed. Will give to front desk. Need to attach last OV note for ADHD

## 2023-03-15 NOTE — Telephone Encounter (Signed)
 Attached ov from 10/24/2020. Left vm letting pt know that her form is ready to be picked up at the front desk.

## 2023-03-18 NOTE — Telephone Encounter (Signed)
 Patient came by today to pick up form 11:42am

## 2023-05-02 ENCOUNTER — Telehealth: Payer: Self-pay | Admitting: Physician Assistant

## 2023-05-02 NOTE — Telephone Encounter (Signed)
 [facility] is requesting refill sertraline  (ZOLOFT ) 50 MG tablet  Please advise

## 2023-05-04 MED ORDER — SERTRALINE HCL 50 MG PO TABS: 50.0000 mg | ORAL_TABLET | Freq: Every day | ORAL | 0 refills | Status: DC

## 2023-05-04 NOTE — Addendum Note (Signed)
 Addended by: [place] on: 05/04/2023 11:32 AM   Modules accepted: Orders

## 2023-05-04 NOTE — Telephone Encounter (Signed)
 [facility] requested 90 day supply of this medication.

## 2023-05-16 ENCOUNTER — Other Ambulatory Visit: Payer: Self-pay | Admitting: Family Medicine

## 2023-05-16 MED ORDER — ONDANSETRON HCL 4 MG PO TABS: 4.0000 mg | ORAL_TABLET | Freq: Three times a day (TID) | ORAL | 0 refills | Status: DC | PRN

## 2023-06-11 ENCOUNTER — Other Ambulatory Visit: Payer: Self-pay | Admitting: Physician Assistant

## 2023-06-13 NOTE — Telephone Encounter (Signed)
 Requested medication (s) are due for refill today: yes  Requested medication (s) are on the active medication list: yes  Last refill:  05/16/23  Future visit scheduled: no  Notes to clinic:  Unable to refill per protocol, cannot delegate.      Requested Prescriptions  Pending Prescriptions Disp Refills   ondansetron  (ZOFRAN ) 4 MG tablet [Pharmacy Med Name: ONDANSETRON  HCL 4 MG TABLET] 18 tablet 1    Sig: TAKE 1 TABLET BY MOUTH EVERY 8 HOURS AS NEEDED FOR NAUSEA AND VOMITING     Not Delegated - Gastroenterology: Antiemetics - ondansetron  Failed - 06/13/2023  2:20 PM      Failed - This refill cannot be delegated      Failed - Valid encounter within last 6 months    Recent Outpatient Visits   None            Passed - AST in normal range and within 360 days    AST  Date Value Ref Range Status  11/23/2022 18 0 - 40 IU/L Final         Passed - ALT in normal range and within 360 days    ALT  Date Value Ref Range Status  11/23/2022 18 0 - 32 IU/L Final

## 2023-08-12 ENCOUNTER — Other Ambulatory Visit: Payer: Self-pay | Admitting: Family Medicine

## 2023-09-13 ENCOUNTER — Telehealth: Payer: Self-pay | Admitting: Family Medicine

## 2023-09-13 ENCOUNTER — Other Ambulatory Visit: Payer: Self-pay

## 2023-09-13 DIAGNOSIS — J3089 Other allergic rhinitis: Secondary | ICD-10-CM

## 2023-09-13 DIAGNOSIS — J40 Bronchitis, not specified as acute or chronic: Secondary | ICD-10-CM

## 2023-09-13 MED ORDER — LEVOCETIRIZINE DIHYDROCHLORIDE 5 MG PO TABS: 5.0000 mg | ORAL_TABLET | Freq: Every evening | ORAL | 3 refills | Status: AC

## 2023-09-13 NOTE — Telephone Encounter (Signed)
[facility] faxed refill request for the following medications:  levocetirizine (XYZAL) 5 MG tablet   Please advise.

## 2023-09-13 NOTE — Telephone Encounter (Signed)
 Converted into a refill request

## 2023-11-09 ENCOUNTER — Ambulatory Visit: Payer: Self-pay

## 2023-11-09 ENCOUNTER — Encounter: Payer: Self-pay | Admitting: Family Medicine

## 2023-11-09 ENCOUNTER — Ambulatory Visit (INDEPENDENT_AMBULATORY_CARE_PROVIDER_SITE_OTHER): Admitting: Family Medicine

## 2023-11-09 VITALS — BP 107/71 | HR 69 | Temp 98.2°F | Resp 14 | Ht 68.0 in | Wt 198.0 lb

## 2023-11-09 DIAGNOSIS — J0140 Acute pansinusitis, unspecified: Secondary | ICD-10-CM | POA: Diagnosis not present

## 2023-11-09 MED ORDER — AMOXICILLIN-POT CLAVULANATE 400-57 MG/5ML PO SUSR: 800.0000 mg | Freq: Two times a day (BID) | ORAL | 0 refills | Status: DC

## 2023-11-09 NOTE — Progress Notes (Signed)
 Acute Office Visit  Introduced to nurse practitioner role and practice setting.  All questions answered.  Discussed provider/patient relationship and expectations.   Subjective:     Patient ID: Kimberly Cameron, female    DOB: [birth year], 28 y.o.   MRN: [id number]  Chief Complaint  Patient presents with   Sinusitis    Sinus pressure, bilateral ear pain, light headed, dizzy ongoing 2 days has worsen. Sore throat onset 5 days otc: alka-seltzer tabs did not help. Night time robitussin only helped sleep. At home covid test Friday - negative   Discussed the use of AI scribe software for clinical note transcription with the patient, who gave verbal consent to proceed. History of Present Illness Kimberly Cameron is a 28 year old female who presents with sinus pressure and ear discomfort.  She has been experiencing sinus pressure and ear discomfort since a little before the weekend, with the initial onset of a sore throat on Thursday night. Her boyfriend had a cold, and while his symptoms have improved, hers have worsened.  The pressure is primarily located in her ears, especially when swallowing, and she experiences episodes of dizziness. She has not had ear infections since childhood, and this is the first time her ears have been affected during a sinus infection. The right ear is slightly worse than the left.  She has been taking nighttime Robitussin, which aids her sleep, but Alka Seltzer and other pain relievers have not alleviated her symptoms. She takes Xyzal  daily for general allergies. She has not been using Flonase, which she has at home.  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 No abdominal pain, diarrhea, nausea, vomiting, chest pain, or difficulty breathing. She reports pressure in her head and behind her eyes. Denies any vision, hearing, or taste changes.  She has a decreased appetite over  the weekend but is otherwise eating and urinating normally. She has tried allergy ear drops without noticeable effect. She mentions feeling like there are 'grapes' in her throat due to swelling and pressure.   HPI  ROS      Objective:    BP 107/71   Pulse 69   Temp 98.2 F (36.8 C) (Oral)   Resp 14   Ht 5' 8 (1.727 m)   Wt 198 lb (89.8 kg)   LMP 10/26/2023   SpO2 98%   BMI 30.11 kg/m    Physical Exam Constitutional:      General: She is not in acute distress.    Appearance: She is well-developed and normal weight. She is not ill-appearing, toxic-appearing or diaphoretic.  HENT:     Head: Normocephalic.     Right Ear: Ear canal normal. No drainage, swelling or tenderness. A middle ear effusion is present. Tympanic membrane is not erythematous.     Left Ear: Ear canal normal. No drainage, swelling or tenderness. A middle ear effusion is present. Tympanic membrane is bulging. Tympanic membrane is not erythematous.     Nose: Congestion present. No rhinorrhea.     Right Sinus: No maxillary sinus tenderness or frontal sinus tenderness.     Left Sinus: No maxillary sinus tenderness.     Mouth/Throat:     Mouth: Mucous membranes are moist. No oral lesions.     Pharynx: Uvula midline. Posterior oropharyngeal erythema present. No pharyngeal swelling, oropharyngeal exudate or uvula swelling.     Tonsils: No tonsillar exudate or tonsillar abscesses. 0 on the  right. 0 on the left.  Eyes:     Extraocular Movements:     Right eye: Normal extraocular motion.     Left eye: Normal extraocular motion.     Conjunctiva/sclera: Conjunctivae normal.     Pupils: Pupils are equal, round, and reactive to light.  Cardiovascular:     Rate and Rhythm: Normal rate and regular rhythm.     Heart sounds: No murmur heard.    No gallop.  Pulmonary:     Effort: Pulmonary effort is normal. No respiratory distress.     Breath sounds: Normal breath sounds. No stridor. No wheezing, rhonchi or rales.   Chest:     Chest wall: No tenderness.  Lymphadenopathy:     Cervical: Cervical adenopathy present.  Neurological:     Mental Status: She is alert.     No results found for any visits on 11/09/23.      Assessment & Plan:  Assessment and Plan Assessment & Plan Acute sinusitis  - most likely started viral in nature, and is progressing into acute sinusitis. Symptoms started seven days ago - mid ear effusion, mild bulging of L TM.  - has pain behind eyes, pressure headache present with eustachian tube dysfunction - given chronicity and no improvement will order ABX, requesting liquid med - Prescribed Augmentin suspension 10 mL twice daily. - Advised use of Flonase twice daily for sinus inflammation. - Recommended symptomatic management with Tylenol or ibuprofen. - Suggested Mucinex to loosen secretions. - hot tea with honey, gargle with warm salt water - increase water intake to reduce dehydration - diet as tolerated - Instructed to follow up if symptoms persist or worsen after 48 hours.  Allergic rhinitis - swollen nasal turbinates - ear fullness - Continue Xyzal  daily. - Use Flonase twice daily for sinus inflammation.  Problem List Items Addressed This Visit   None Visit Diagnoses       Acute non-recurrent pansinusitis    -  Primary   Relevant Medications   amoxicillin -clavulanate (AUGMENTIN) 400-57 MG/5ML suspension       Meds ordered this encounter  Medications   amoxicillin -clavulanate (AUGMENTIN) 400-57 MG/5ML suspension    Sig: Take 10 mLs (800 mg total) by mouth 2 (two) times daily.    Dispense:  100 mL    Refill:  0    Return if symptoms worsen or fail to improve.  Curtis DELENA Boom, FNP  I, Curtis DELENA Boom, FNP, have reviewed all documentation for this visit. The documentation on 11/09/23 for the exam, diagnosis, procedures, and orders are all accurate and complete.

## 2023-11-09 NOTE — Telephone Encounter (Signed)
 FYI Only or Action Required?: FYI only for provider: appointment scheduled on 11.5.25.  Patient was last seen in primary care on 01/31/2023 by Myrla Jon HERO, MD.  Called Nurse Triage reporting Sinusitis.  Symptoms began several days ago.  Interventions attempted: OTC medications: alkaseltzer.  Symptoms are: unchanged.  Triage Disposition: See PCP When Office is Open (Within 3 Days)  Patient/caregiver understands and will follow disposition?: YES      Copied from CRM #[id number]. Topic: Clinical - Red Word Triage >> Nov 09, 2023 12:19 PM Joesph NOVAK wrote: Red Word that prompted transfer to Nurse Triage: sinus infection, sore throat ( painful and scratchy ) swollen lymph nodes. Her ears hurt as well. She is requesting a antibiotic. Reason for Disposition  [1] Ear congestion lasts > 3 days AND [2] no improvement after using Care Advice  (Exception: Ear congestion is a chronic symptom.)  Answer Assessment - Initial Assessment Questions Covid test on Friday - negative    1. LOCATION: Where does it hurt?      Sinuses and ears 2. ONSET: When did the sinus pain start?  (e.g., hours, days)      Thursday night 3. SEVERITY: How bad is the pain?   (Scale 0-10; or none, mild, moderate or severe)     6-7 in ears 4. RECURRENT SYMPTOM: Have you ever had sinus problems before? If Yes, ask: When was the last time? and What happened that time?      yes 5. NASAL CONGESTION: Is the nose blocked? If Yes, ask: Can you open it or must you breathe through your mouth?     Not really 6. NASAL DISCHARGE: Do you have discharge from your nose? If so ask, What color?     States feels it down her throat 7. FEVER: Do you have a fever? If Yes, ask: What is it, how was it measured, and when did it start?      no 8. OTHER SYMPTOMS: Do you have any other symptoms? (e.g., sore throat, cough, earache, difficulty breathing)     Dizziness, worn out really easy, sore throat 9.  PREGNANCY: Is there any chance you are pregnant? When was your last menstrual period?     no  Answer Assessment - Initial Assessment Questions 1. LOCATION: Which ear is involved?       both 2. SENSATION: Describe how the ear feels. (e.g., stuffy, full, plugged).      Stuffy, painful 3. ONSET:  When did the ear symptoms start?       Last Thursday started with sinus/cold symptoms 4. PAIN: Do you also have an earache? If Yes, ask: How bad is it? (Scale 0-10; none, mild, moderate or severe)     6-7 5. CAUSE: What do you think is causing the ear congestion? (e.g., common cold, nasal allergies, recent flight, recent snorkeling)     Currently has a cold/sinus  6. OTHER SYMPTOMS: Do you have any other symptoms? (e.g., ear drainage, hay fever symptoms such as sneezing or a clear nasal discharge; cold symptoms such as a cough or runny nose)     States can feel it dripping in her throat but her nose isn't really stuffy.  7. PREGNANCY: Is there any chance you are pregnant? When was your last menstrual period?     no  Protocols used: Sinus Pain or Congestion-A-AH, Ear - Congestion-A-AH

## 2023-11-25 ENCOUNTER — Other Ambulatory Visit: Payer: Self-pay | Admitting: Family Medicine

## 2023-11-28 ENCOUNTER — Other Ambulatory Visit: Payer: Self-pay | Admitting: Family Medicine

## 2023-11-28 ENCOUNTER — Other Ambulatory Visit: Payer: Self-pay

## 2023-11-28 DIAGNOSIS — J0140 Acute pansinusitis, unspecified: Secondary | ICD-10-CM

## 2023-11-28 MED ORDER — AMOXICILLIN-POT CLAVULANATE 400-57 MG/5ML PO SUSR: 800.0000 mg | Freq: Two times a day (BID) | ORAL | 0 refills | Status: AC

## 2023-11-28 MED ORDER — SERTRALINE HCL 50 MG PO TABS: 50.0000 mg | ORAL_TABLET | Freq: Every day | ORAL | 0 refills | Status: DC

## 2023-11-28 NOTE — Telephone Encounter (Signed)
 Refill of Augmentin  sent.

## 2023-11-28 NOTE — Telephone Encounter (Signed)
 Copied from CRM #[id number]. Topic: Clinical - Prescription Issue >> Nov 28, 2023 11:54 AM Delon DASEN wrote: Reason for CRM: sertraline  (ZOLOFT ) 50 MG tablet- trying to get her refill but is shows needs an appt but just had appt on 11/5 for acute symptoms, scheduling a refill appt

## 2023-11-28 NOTE — Telephone Encounter (Signed)
 Copied from CRM #[id number]. Topic: Clinical - Medication Question >> Nov 28, 2023 12:01 PM Kimberly Cameron wrote: Reason for CRM: amoxicillin -clavulanate (AUGMENTIN ) 400-57 MG/5ML suspension- waited a week before deciding to take it but it turned orange and pharmacy said it went bad and needs new prescription- sertraline  (ZOLOFT ) 50 MG tablet- need filled asap, has been out since last week and is starting to feel the effects of not taking it- [phone]

## 2023-11-28 NOTE — Telephone Encounter (Signed)
 Copied from CRM #[id number]. Topic: Clinical - Medication Refill >> Nov 28, 2023 12:00 PM Delon DASEN wrote: Medication: sertraline  (ZOLOFT ) 50 MG tablet   Has the patient contacted their pharmacy? Yes (Agent: If no, request that the patient contact the pharmacy for the refill. If patient does not wish to contact the pharmacy document the reason why and proceed with request.) (Agent: If yes, when and what did the pharmacy advise?)  This is the patient's preferred pharmacy:  [id number] - [place], [place] - [address] [place] [address] Phone: [phone] Fax: [phone]  Is this the correct pharmacy for this prescription? Yes If no, delete pharmacy and type the correct one.   Has the prescription been filled recently? Yes  Is the patient out of the medication? Yes  Has the patient been seen for an appointment in the last year OR does the patient have an upcoming appointment? Yes  Can we respond through MyChart? Yes  Agent: Please be advised that Rx refills may take up to 3 business days. We ask that you follow-up with your pharmacy.

## 2023-11-29 NOTE — Telephone Encounter (Signed)
 Pt advised. Verbalized understanding.

## 2023-12-06 ENCOUNTER — Encounter: Payer: Self-pay | Admitting: Family Medicine

## 2023-12-06 ENCOUNTER — Telehealth: Admitting: Family Medicine

## 2023-12-06 DIAGNOSIS — F411 Generalized anxiety disorder: Secondary | ICD-10-CM

## 2023-12-06 DIAGNOSIS — J3089 Other allergic rhinitis: Secondary | ICD-10-CM

## 2023-12-06 DIAGNOSIS — F909 Attention-deficit hyperactivity disorder, unspecified type: Secondary | ICD-10-CM

## 2023-12-06 DIAGNOSIS — Z3009 Encounter for other general counseling and advice on contraception: Secondary | ICD-10-CM

## 2023-12-06 DIAGNOSIS — R6882 Decreased libido: Secondary | ICD-10-CM | POA: Diagnosis not present

## 2023-12-06 MED ORDER — DEXMETHYLPHENIDATE HCL 2.5 MG PO TABS: 2.5000 mg | ORAL_TABLET | Freq: Every day | ORAL | 0 refills | Status: AC | PRN

## 2023-12-06 MED ORDER — SERTRALINE HCL 50 MG PO TABS: 50.0000 mg | ORAL_TABLET | Freq: Every day | ORAL | 1 refills | Status: AC

## 2023-12-06 NOTE — Assessment & Plan Note (Signed)
 Managed with Xyzal . She experiences persistent drainage and sneezing. Xyzal  causes itching, a common side effect of antihistamines. Discussed potential transition to Allegra to reduce itching. - Continue Xyzal  as currently prescribed - Consider transitioning to Allegra if itching persists

## 2023-12-06 NOTE — Assessment & Plan Note (Signed)
 Well-controlled with Zoloft  50 mg daily. Anxiety symptoms are absent when medication is taken consistently. Withdrawal symptoms occur if medication is missed for a week. No side effects reported from Zoloft . - Continue Zoloft  50 mg daily - Ensured adequate refills for Zoloft 

## 2023-12-06 NOTE — Progress Notes (Signed)
 MyChart Video Visit    Virtual Visit via Video Note   This format is felt to be most appropriate for this patient at this time. Physical exam was limited by quality of the video and audio technology used for the visit.    Patient location: home Provider location: [facility] Persons involved in the visit: patient, provider  I discussed the limitations of evaluation and management by telemedicine and the availability of in person appointments. The patient expressed understanding and agreed to proceed.  Patient: Kimberly Cameron   DOB: [birth year]   28 y.o. Female  MRN: [id number] Visit Date: 12/06/2023  Today's healthcare provider: Jon Eva, MD   No chief complaint on file.  Subjective    HPI   Discussed the use of AI scribe software for clinical note transcription with the patient, who gave verbal consent to proceed.  History of Present Illness   Kimberly Cameron is a 28 year old female who presents for medication refills.  She recently ran out of Zoloft  50 mg daily and is having withdrawal symptoms, which she describes as horrible, including waking up with panic when she misses doses. She feels well on Zoloft  when she takes it consistently and reports no side effects other than decreased libido.  She has NuvaRing at home but has not started it due to fear of side effects like the severe nausea and functional impairment she had on the Depo-Provera  shot.  She notes decreased libido since starting Zoloft , which improved during a week off the medication. She previously used Wellbutrin  and Lexapro  but stopped them and does not wish to resume Lexapro  because it made her feel way too calm and empty.  She has ADHD and uses Focalin  2.5 mg as needed, usually half a tablet, and has taken it twice in the past week for studying.  She takes Xyzal  daily for allergies. She still has drainage and sneezing but keeps taking it because stopping it causes intense  itching.  She denies current anxiety or depression symptoms while on Zoloft  and scores zero on most screening questions. She notes overeating and some trouble concentrating, which she attributes to ADHD.          12/06/2023   10:34 AM 11/23/2022    8:36 AM 04/25/2020   11:51 AM 12/19/2019   10:56 AM  GAD 7 : Generalized Anxiety Score  Nervous, Anxious, on Edge 0 1 1 3   Control/stop worrying 0 1 1 3   Worry too much - different things 0 0 1 1  Trouble relaxing 0 0 2 1  Restless 0 0 0 0  Easily annoyed or irritable 0 0 1 0  Afraid - awful might happen 0 0 0 0  Total GAD 7 Score 0 2 6 8   Anxiety Difficulty Not difficult at all Not difficult at all Very difficult Not difficult at all        12/06/2023   10:35 AM 11/23/2022    8:36 AM 06/24/2022    1:10 PM 12/31/2021   11:13 AM 10/19/2021    8:43 AM  Depression screen PHQ 2/9  Decreased Interest 0 0 0 0 0  Down, Depressed, Hopeless 0 0 0 0 0  PHQ - 2 Score 0 0 0 0 0  Altered sleeping 0 1 1  1   Tired, decreased energy 0 1 1  1   Change in appetite 1 1 0  0  Feeling bad or failure about yourself  0 0 0  1  Trouble concentrating 1 1 1   0  Moving slowly or fidgety/restless 0 0 0  0  Suicidal thoughts 0 0 0  0  PHQ-9 Score 2 4  3   3    Difficult doing work/chores Not difficult at all Not difficult at all Not difficult at all  Not difficult at all     Data saved with a previous flowsheet row definition    Review of Systems      Objective    LMP 10/26/2023       Physical Exam Constitutional:      General: She is not in acute distress.    Appearance: Normal appearance.  HENT:     Head: Normocephalic.  Pulmonary:     Effort: Pulmonary effort is normal. No respiratory distress.  Neurological:     Mental Status: She is alert and oriented to person, place, and time. Mental status is at baseline.        Assessment & Plan     Problem List Items Addressed This Visit       Respiratory   Non-seasonal allergic  rhinitis   Managed with Xyzal . She experiences persistent drainage and sneezing. Xyzal  causes itching, a common side effect of antihistamines. Discussed potential transition to Allegra to reduce itching. - Continue Xyzal  as currently prescribed - Consider transitioning to Allegra if itching persists        Other   GAD (generalized anxiety disorder) - Primary   Well-controlled with Zoloft  50 mg daily. Anxiety symptoms are absent when medication is taken consistently. Withdrawal symptoms occur if medication is missed for a week. No side effects reported from Zoloft . - Continue Zoloft  50 mg daily - Ensured adequate refills for Zoloft       Relevant Medications   sertraline  (ZOLOFT ) 50 MG tablet   Attention deficit hyperactivity disorder (ADHD)   Managed with Focalin  as needed. She takes half of a 2.5 mg dose, which is effective for her needs. No current prescription issues, but she requests a refill for future use. - Sent refill for Focalin  2.5 mg to pharmacy      Other Visit Diagnoses       Birth control counseling         Low libido               Decreased libido secondary to SSRI use Decreased libido is a known side effect of Zoloft . She has experienced this since starting Zoloft . Discussed potential benefits of estrogen in NuvaRing for libido improvement. Wellbutrin  was previously used but discontinued due to excessive calmness and lack of emotional response. - Consider NuvaRing for potential libido improvement - Monitor libido changes with NuvaRing use  Contraceptive management counseling She has NuvaRing but has not started it due to fear of side effects experienced with Depo shot. Discussed differences between NuvaRing and Depo shot, including the presence of estrogen in NuvaRing and the ability to remove it if side effects occur. NuvaRing may help with libido due to estrogen content. - Encouraged trial of NuvaRing with reassurance about its differences from Depo  shot  General health maintenance Routine health maintenance discussed, including the need for a wellness visit to review screenings and overall health. - Scheduled wellness visit in approximately six months       Meds ordered this encounter  Medications   sertraline  (ZOLOFT ) 50 MG tablet    Sig: Take 1 tablet (50 mg total) by mouth daily.    Dispense:  90 tablet    Refill:  1   dexmethylphenidate  (FOCALIN ) 2.5 MG tablet    Sig: Take 1 tablet (2.5 mg total) by mouth daily as needed (ADD).    Dispense:  30 tablet    Refill:  0     Return in about 6 months (around 06/05/2024) for CPE.     I discussed the assessment and treatment plan with the patient. The patient was provided an opportunity to ask questions and all were answered. The patient agreed with the plan and demonstrated an understanding of the instructions.   The patient was advised to call back or seek an in-person evaluation if the symptoms worsen or if the condition fails to improve as anticipated.  Jon Eva, MD [facility] [phone] (phone) [phone] (fax)  [facility]

## 2023-12-06 NOTE — Assessment & Plan Note (Signed)
 Managed with Focalin  as needed. She takes half of a 2.5 mg dose, which is effective for her needs. No current prescription issues, but she requests a refill for future use. - Sent refill for Focalin  2.5 mg to pharmacy

## 2024-01-02 ENCOUNTER — Ambulatory Visit: Admitting: Family Medicine
